# Patient Record
Sex: Male | Born: 1995
Health system: Southern US, Community
[De-identification: ages and names within clinical notes are randomized; demographics above are authoritative.]

## PROBLEM LIST (undated history)

## (undated) DIAGNOSIS — L301 Dyshidrosis [pompholyx]: Secondary | ICD-10-CM

## (undated) DIAGNOSIS — F329 Major depressive disorder, single episode, unspecified: Secondary | ICD-10-CM

## (undated) DIAGNOSIS — F32A Depression, unspecified: Secondary | ICD-10-CM

## (undated) HISTORY — DX: Dyshidrosis (pompholyx): L30.1

## (undated) HISTORY — PX: TYMPANOTOMY: SHX2588

---
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
## 2014-03-12 ENCOUNTER — Encounter (HOSPITAL_COMMUNITY): Payer: Self-pay | Admitting: Emergency Medicine

## 2014-03-12 ENCOUNTER — Emergency Department (HOSPITAL_COMMUNITY)
Admission: EM | Admit: 2014-03-12 | Discharge: 2014-03-13 | Disposition: A | Discharge status: Home / Self Care | Payer: Medicaid Other | Attending: Emergency Medicine | Admitting: Emergency Medicine

## 2014-03-12 DIAGNOSIS — Z79899 Other long term (current) drug therapy: Secondary | ICD-10-CM | POA: Insufficient documentation

## 2014-03-12 DIAGNOSIS — N3090 Cystitis, unspecified without hematuria: Secondary | ICD-10-CM | POA: Insufficient documentation

## 2014-03-12 DIAGNOSIS — R109 Unspecified abdominal pain: Secondary | ICD-10-CM | POA: Insufficient documentation

## 2014-03-12 DIAGNOSIS — Z8659 Personal history of other mental and behavioral disorders: Secondary | ICD-10-CM | POA: Diagnosis not present

## 2014-03-12 DIAGNOSIS — N50819 Testicular pain, unspecified: Secondary | ICD-10-CM

## 2014-03-12 LAB — COMPREHENSIVE METABOLIC PANEL
ALT: 32 U/L (ref 0–53)
AST: 21 U/L (ref 0–37)
Albumin: 4.4 g/dL (ref 3.5–5.2)
Alkaline Phosphatase: 54 U/L (ref 39–117)
Anion gap: 11 (ref 5–15)
BILIRUBIN TOTAL: 0.5 mg/dL (ref 0.3–1.2)
BUN: 13 mg/dL (ref 6–23)
CALCIUM: 9.6 mg/dL (ref 8.4–10.5)
CHLORIDE: 101 meq/L (ref 96–112)
CO2: 27 meq/L (ref 19–32)
Creatinine, Ser: 0.94 mg/dL (ref 0.50–1.35)
GLUCOSE: 101 mg/dL — AB (ref 70–99)
Potassium: 4.3 mEq/L (ref 3.7–5.3)
Sodium: 139 mEq/L (ref 137–147)
Total Protein: 7.1 g/dL (ref 6.0–8.3)

## 2014-03-12 LAB — CBC WITH DIFFERENTIAL/PLATELET
BASOS ABS: 0 10*3/uL (ref 0.0–0.1)
Basophils Relative: 0 % (ref 0–1)
EOS PCT: 2 % (ref 0–5)
Eosinophils Absolute: 0.1 10*3/uL (ref 0.0–0.7)
HCT: 45 % (ref 39.0–52.0)
Hemoglobin: 16.4 g/dL (ref 13.0–17.0)
LYMPHS ABS: 2.4 10*3/uL (ref 0.7–4.0)
LYMPHS PCT: 37 % (ref 12–46)
MCH: 31.2 pg (ref 26.0–34.0)
MCHC: 36.4 g/dL — ABNORMAL HIGH (ref 30.0–36.0)
MCV: 85.6 fL (ref 78.0–100.0)
Monocytes Absolute: 0.5 10*3/uL (ref 0.1–1.0)
Monocytes Relative: 8 % (ref 3–12)
NEUTROS ABS: 3.5 10*3/uL (ref 1.7–7.7)
Neutrophils Relative %: 53 % (ref 43–77)
Platelets: 176 10*3/uL (ref 150–400)
RBC: 5.26 MIL/uL (ref 4.22–5.81)
RDW: 11.9 % (ref 11.5–15.5)
WBC: 6.5 10*3/uL (ref 4.0–10.5)

## 2014-03-12 LAB — LIPASE, BLOOD: Lipase: 31 U/L (ref 11–59)

## 2014-03-12 NOTE — ED Notes (Signed)
Pt states that "5 days ago, I began having testicular pain which graduated to specifically right and left abdominal pain that radiated to my back and I pee a lot." Bilateral testicle pain.  No redness or swelling.  "I also have a growth on my penis and I want to make sure it's not a tumor".  I visualized this "growth".  Appx 1 mm or smaller bump noted to base of penis.  Pt would like for me to add that this bump has been there for 4 months.  Has been taking ibuprofen.  Denies NVD.  But states it hurts to urinate.

## 2014-03-12 NOTE — ED Provider Notes (Signed)
CSN: [id number]     Arrival date & time 03/12/14  1854 History   First MD Initiated Contact with Patient 03/12/14 2336     Chief Complaint  Patient presents with  . Abdominal Pain  . Testicle Pain     (Consider location/radiation/quality/duration/timing/severity/associated sxs/prior Treatment) The history is provided by the patient. No language interpreter was used.  Vincent Bauer is a 18 y/o M with PMHx of depression presenting to the ED, with mother, regarding abdominal pain and bilateral testicle pain that has been ongoing for approximately 1 week. Patient reported that the abdominal pain started in the groin and worked its way to the lower portion of the abdomen. Patient reported that the pain is a dull, burning pain to bilateral lower quadrants that have been constant. Patient reported that the testicle pain is an aching sensation that has been constant. Stated that he has been having increase in urination with mild discomfort after urination, described as a burning. Patient reported that he has been taking Ibuprofen with minimal relief. Mother reported that she gave the patient Azo as well. Reported mild lower back pain. Patient reported that he has been having a lesion to the base of the penis for the past 4 months - stated that he denied growth over the past 4 months - stated that there is no pain and it has mild hardness upon palpation. Denied being sexually active. Denied swelling, changes to skin color, penile discharge, bleeding, penile swelling, nausea, vomiting, diarrhea, melena, hematochezia, fever, chills, dizziness, headache, blurred vision, sudden loss of vision, syncope, increased thirst. PCP none  Past Medical History  Diagnosis Date  . Depression    Past Surgical History  Procedure Laterality Date  . Tympanotomy     No family history on file. History  Substance Use Topics  . Smoking status: Never Smoker   . Smokeless tobacco: Not on file  . Alcohol Use: No     Review of Systems  Constitutional: Negative for fever and chills.  Eyes: Negative for visual disturbance.  Respiratory: Negative for chest tightness and shortness of breath.   Cardiovascular: Negative for chest pain.  Gastrointestinal: Positive for abdominal pain. Negative for nausea, vomiting, diarrhea, constipation, blood in stool and anal bleeding.  Genitourinary: Positive for dysuria, genital sores and testicular pain. Negative for hematuria, flank pain, discharge, penile swelling, scrotal swelling and penile pain.  Musculoskeletal: Positive for back pain.  Neurological: Negative for dizziness and weakness.      Allergies  Review of patient's allergies indicates no known allergies.  Home Medications   Prior to Admission medications   Medication Sig Start Date End Date Taking? Authorizing Provider  ibuprofen (ADVIL,MOTRIN) 200 MG tablet Take 600 mg by mouth every 6 (six) hours as needed (for pain.).   Yes Historical Provider, MD  Phenazopyridine HCl (AZO TABS PO) Take 2 tablets by mouth once.   Yes Historical Provider, MD  cephALEXin (KEFLEX) 500 MG capsule Take 1 capsule (500 mg total) by mouth 2 (two) times daily. 03/13/14   Welby Montminy, PA-C   BP 123/74 mmHg  Pulse 70  Temp(Src) 98.7 F (37.1 C) (Oral)  Resp 18  SpO2 99% Physical Exam  Constitutional: He is oriented to person, place, and time. He appears well-developed and well-nourished. No distress.  HENT:  Head: Normocephalic and atraumatic.  Mouth/Throat: Oropharynx is clear and moist. No oropharyngeal exudate.  Eyes: Conjunctivae are normal. Pupils are equal, round, and reactive to light. Right eye exhibits no discharge. Left eye exhibits  no discharge.  Neck: Normal range of motion. Neck supple. No tracheal deviation present.  Cardiovascular: Normal rate, regular rhythm and normal heart sounds.   Pulmonary/Chest: Effort normal and breath sounds normal. No respiratory distress. He has no wheezes. He has no  rales.  Abdominal: Soft. Bowel sounds are normal. He exhibits no distension. There is tenderness. There is no rebound and no guarding.  Negative abdominal distention Bowel sounds normal active in all 4 quadrants Abdomen soft upon palpation Discomfort upon palpation to the right lower quadrant with rovsings Positive voluntary guarding upon palpation to the right lower quadrant  Genitourinary: Penis normal. No penile tenderness.  Penile exam: Uncircumcised. Negative swelling, erythema, inflammation, lesions, deformities identified to the penis and scrotum. Negative penile discharge. Negative lesions, chancres. Negative inguinal lymphadenopathy bilaterally. Mild discomfort upon palpation to the inguinal regions. Bilateral discomfort upon palpation to the testicles. Negative varicocele. Negative hydrocele. Negative high riding testicle. Negative direct or indirect inguinal hernia palpated. Miniscule red lesion noted to the left side of the penis, at base with negative bleeding, drainage. Negative pain upon palpation. Measures approximately 0.5 mm. Exam chaperoned with Tech  Musculoskeletal: Normal range of motion.  Lymphadenopathy:    He has no cervical adenopathy.  Neurological: He is alert and oriented to person, place, and time. No cranial nerve deficit. He exhibits normal muscle tone. Coordination normal.  Skin: Skin is warm and dry. No rash noted. He is not diaphoretic. No erythema.  Psychiatric: He has a normal mood and affect. His behavior is normal. Thought content normal.  Nursing note and vitals reviewed.   ED Course  Procedures (including critical care time)  Results for orders placed or performed during the hospital encounter of 03/12/14  CBC with Differential  Result Value Ref Range   WBC 6.5 4.0 - 10.5 K/uL   RBC 5.26 4.22 - 5.81 MIL/uL   Hemoglobin 16.4 13.0 - 17.0 g/dL   HCT 54.0 98.1 - 19.1 %   MCV 85.6 78.0 - 100.0 fL   MCH 31.2 26.0 - 34.0 pg   MCHC 36.4 (H) 30.0 - 36.0  g/dL   RDW 47.8 29.5 - 62.1 %   Platelets 176 150 - 400 K/uL   Neutrophils Relative % 53 43 - 77 %   Neutro Abs 3.5 1.7 - 7.7 K/uL   Lymphocytes Relative 37 12 - 46 %   Lymphs Abs 2.4 0.7 - 4.0 K/uL   Monocytes Relative 8 3 - 12 %   Monocytes Absolute 0.5 0.1 - 1.0 K/uL   Eosinophils Relative 2 0 - 5 %   Eosinophils Absolute 0.1 0.0 - 0.7 K/uL   Basophils Relative 0 0 - 1 %   Basophils Absolute 0.0 0.0 - 0.1 K/uL  Comprehensive metabolic panel  Result Value Ref Range   Sodium 139 137 - 147 mEq/L   Potassium 4.3 3.7 - 5.3 mEq/L   Chloride 101 96 - 112 mEq/L   CO2 27 19 - 32 mEq/L   Glucose, Bld 101 (H) 70 - 99 mg/dL   BUN 13 6 - 23 mg/dL   Creatinine, Ser 3.08 0.50 - 1.35 mg/dL   Calcium 9.6 8.4 - 65.7 mg/dL   Total Protein 7.1 6.0 - 8.3 g/dL   Albumin 4.4 3.5 - 5.2 g/dL   AST 21 0 - 37 U/L   ALT 32 0 - 53 U/L   Alkaline Phosphatase 54 39 - 117 U/L   Total Bilirubin 0.5 0.3 - 1.2 mg/dL   GFR calc non  Af Amer >90 >90 mL/min   GFR calc Af Amer >90 >90 mL/min   Anion gap 11 5 - 15  Lipase, blood  Result Value Ref Range   Lipase 31 11 - 59 U/L  Urinalysis, Routine w reflex microscopic  Result Value Ref Range   Color, Urine ORANGE (A) YELLOW   APPearance CLEAR CLEAR   Specific Gravity, Urine 1.006 1.005 - 1.030   pH 6.0 5.0 - 8.0   Glucose, UA NEGATIVE NEGATIVE mg/dL   Hgb urine dipstick NEGATIVE NEGATIVE   Bilirubin Urine NEGATIVE NEGATIVE   Ketones, ur NEGATIVE NEGATIVE mg/dL   Protein, ur NEGATIVE NEGATIVE mg/dL   Urobilinogen, UA 1.0 0.0 - 1.0 mg/dL   Nitrite POSITIVE (A) NEGATIVE   Leukocytes, UA NEGATIVE NEGATIVE  Urine microscopic-add on  Result Value Ref Range   Squamous Epithelial / LPF RARE RARE   WBC, UA 3-6 <3 WBC/hpf    Labs Review Labs Reviewed  CBC WITH DIFFERENTIAL - Abnormal; Notable for the following:    MCHC 36.4 (*)    All other components within normal limits  COMPREHENSIVE METABOLIC PANEL - Abnormal; Notable for the following:    Glucose,  Bld 101 (*)    All other components within normal limits  URINALYSIS, ROUTINE W REFLEX MICROSCOPIC - Abnormal; Notable for the following:    Color, Urine ORANGE (*)    Nitrite POSITIVE (*)    All other components within normal limits  GC/CHLAMYDIA PROBE AMP  URINE CULTURE  LIPASE, BLOOD  URINE MICROSCOPIC-ADD ON  HIV ANTIBODY (ROUTINE TESTING)  RPR    Imaging Review US Scrotum  03/13/2014   CLINICAL DATA:  Testicular pain  EXAM: SCROTAL ULTRASOUND  DOPPLER ULTRASOUND OF THE TESTICLES  TECHNIQUE: Complete ultrasound examination of the testicles, epididymis, and other scrotal structures was performed. Color and spectral Doppler ultrasound were also utilized to evaluate blood flow to the testicles.  COMPARISON:  None.  FINDINGS: Right testicle  Measurements: 4.3 x 2 x 3 cm.  No mass or microlithiasis visualized.  Left testicle  Measurements: 4.1 x 2 x 2.6 cm. There is a hyperechoic 5 mm extratesticular shadowing focus within the left scrotal sac likely representing a calcified scrotal pearl. No mass or microlithiasis visualized.  Right epididymis:  Normal in size and appearance.  Left epididymis:  Normal in size and appearance.  Hydrocele:  None visualized.  Varicocele:  None visualized.  Pulsed Doppler interrogation of both testes demonstrates low resistance arterial and venous waveforms bilaterally.  IMPRESSION: 1. No testicular torsion. 2. No testicular mass.   Electronically Signed   By: Elige Ko   On: 03/13/2014 01:49   Ct Abdomen Pelvis W Contrast  03/13/2014   CLINICAL DATA:  Testicular pain for 5 days.  Painful urination.  EXAM: CT ABDOMEN AND PELVIS WITH CONTRAST  TECHNIQUE: Multidetector CT imaging of the abdomen and pelvis was performed using the standard protocol following bolus administration of intravenous contrast.  CONTRAST:  50mL OMNIPAQUE IOHEXOL 300 MG/ML SOLN, OMNIPAQUE IOHEXOL 300 MG/ML SOLN  COMPARISON:  None.  FINDINGS: The lung bases are clear.  The liver  demonstrates no focal abnormality. There is no intrahepatic or extrahepatic biliary ductal dilatation. The gallbladder is normal. The spleen demonstrates no focal abnormality. The kidneys, adrenal glands and pancreas are normal. The bladder is unremarkable.  The stomach, duodenum, small intestine, and large intestine demonstrate no contrast extravasation or dilatation. There is a normal caliber appendix in the right lower quadrant without periappendiceal inflammatory changes. There  is no pneumoperitoneum, pneumatosis, or portal venous gas. There is no abdominal or pelvic free fluid. There is no lymphadenopathy.  The abdominal aorta is normal in caliber .  There are no lytic or sclerotic osseous lesions.  IMPRESSION: No acute abdominal or pelvic pathology.   Electronically Signed   By: Elige KoHetal  Patel   On: 03/13/2014 02:50   [place] Art/ven Flow Abd Pelv Doppler  03/13/2014   CLINICAL DATA:  Testicular pain  EXAM: SCROTAL ULTRASOUND  DOPPLER ULTRASOUND OF THE TESTICLES  TECHNIQUE: Complete ultrasound examination of the testicles, epididymis, and other scrotal structures was performed. Color and spectral Doppler ultrasound were also utilized to evaluate blood flow to the testicles.  COMPARISON:  None.  FINDINGS: Right testicle  Measurements: 4.3 x 2 x 3 cm.  No mass or microlithiasis visualized.  Left testicle  Measurements: 4.1 x 2 x 2.6 cm. There is a hyperechoic 5 mm extratesticular shadowing focus within the left scrotal sac likely representing a calcified scrotal pearl. No mass or microlithiasis visualized.  Right epididymis:  Normal in size and appearance.  Left epididymis:  Normal in size and appearance.  Hydrocele:  None visualized.  Varicocele:  None visualized.  Pulsed Doppler interrogation of both testes demonstrates low resistance arterial and venous waveforms bilaterally.  IMPRESSION: 1. No testicular torsion. 2. No testicular mass.   Electronically Signed   By: Elige KoHetal  Patel   On: 03/13/2014 01:49     EKG  Interpretation None      MDM   Final diagnoses:  Testicle pain  Abdominal pain  Cystitis   Medications  cefTRIAXone (ROCEPHIN) injection 250 mg (not administered)  azithromycin (ZITHROMAX) tablet 1,000 mg (not administered)  sodium chloride 0.9 % bolus 1,000 mL (0 mLs Intravenous Stopped 03/13/14 0200)  iohexol (OMNIPAQUE) 300 MG/ML solution 50 mL (50 mLs Oral Contrast Given 03/13/14 0215)  morphine 2 MG/ML injection 2 mg (2 mg Intravenous Given 03/13/14 0249)  iohexol (OMNIPAQUE) 300 MG/ML solution 100 mL (100 mLs Intravenous Contrast Given 03/13/14 0231)    Filed Vitals:   03/12/14 1911 03/12/14 2222 03/13/14 0000 03/13/14 0057  BP: 131/70 143/71  123/74  Pulse: 76 90  70  Temp: 98.7 F (37.1 C) 98.7 F (37.1 C)    TempSrc: Oral Oral    Resp: 18 18  18   SpO2: 100% 100% 99% 99%   CBC negative elevated white blood cell count. Hemoglobin 16.4, hematocrit 45.0. CMP unremarkable. Glucose 101. Negative elevated anion gap-11.0 mEq per liter. Lipase negative elevation. Urinalysis positive for nitrates with a white blood cell count of 3-6. Urine culture pending. HIV and GC chlamydia urine pending. Scrotal ultrasound and Doppler no testicular torsion or mass identified. CT abdomen and pelvis with contrast noted acute abdominal or pelvic pathology noted. Doubt appendicitis. Doubt pancreatitis. Doubt acute inflammatory abdominal processes. Negative findings of testicular torsion. Doubt epididymitis. Doubt pyelonephritis. Suspicion to be possible cystitis secondary to nitrites identified and urine. Covered patient prophylactically. Negative signs of sepsis-patient afebrile, not tachycardia, no tachypneic. Negative elevated white blood cell count. Discharged patient. Discharged patient with antibiotics. Discussed with patient to rest and stay hydrated. Discussed with patient to avoid any physical or strenuous activity. [facility]. Discussed with  patient to follow-up with urology regarding cystitis as well as lesion on penis that has been present for 4 months. Discussed with patient to closely monitor symptoms and if symptoms are to worsen or change to report back to the ED - strict return  instructions given.  Patient agreed to plan of care, understood, all questions answered.   Raymon Mutton, PA-C 03/13/14 1610  Tomasita Crumble, MD 03/13/14 [phone]

## 2014-03-13 ENCOUNTER — Emergency Department (HOSPITAL_COMMUNITY): Payer: Medicaid Other

## 2014-03-13 LAB — URINALYSIS, ROUTINE W REFLEX MICROSCOPIC
BILIRUBIN URINE: NEGATIVE
Glucose, UA: NEGATIVE mg/dL
HGB URINE DIPSTICK: NEGATIVE
KETONES UR: NEGATIVE mg/dL
LEUKOCYTES UA: NEGATIVE
Nitrite: POSITIVE — AB
PH: 6 (ref 5.0–8.0)
PROTEIN: NEGATIVE mg/dL
Specific Gravity, Urine: 1.006 (ref 1.005–1.030)
Urobilinogen, UA: 1 mg/dL (ref 0.0–1.0)

## 2014-03-13 LAB — RPR

## 2014-03-13 LAB — URINE MICROSCOPIC-ADD ON

## 2014-03-13 LAB — HIV ANTIBODY (ROUTINE TESTING W REFLEX): HIV 1&2 Ab, 4th Generation: NONREACTIVE

## 2014-03-13 MED ORDER — IOHEXOL 300 MG/ML  SOLN
50.0000 mL | Freq: Once | INTRAMUSCULAR | Status: AC | PRN
  Administered 2014-03-13: 50 mL via ORAL

## 2014-03-13 MED ORDER — CEFTRIAXONE SODIUM 250 MG IJ SOLR
250.0000 mg | Freq: Once | INTRAMUSCULAR | Status: AC
  Administered 2014-03-13: 250 mg via INTRAMUSCULAR
  Filled 2014-03-13: qty 250

## 2014-03-13 MED ORDER — LIDOCAINE HCL 1 % IJ SOLN
INTRAMUSCULAR | Status: AC
  Administered 2014-03-13: 0.9 mL via INTRAMUSCULAR
  Filled 2014-03-13: qty 20

## 2014-03-13 MED ORDER — SODIUM CHLORIDE 0.9 % IV BOLUS (SEPSIS)
1000.0000 mL | Freq: Once | INTRAVENOUS | Status: AC
  Administered 2014-03-13: 1000 mL via INTRAVENOUS

## 2014-03-13 MED ORDER — AZITHROMYCIN 250 MG PO TABS
1000.0000 mg | ORAL_TABLET | Freq: Once | ORAL | Status: AC
  Administered 2014-03-13: 1000 mg via ORAL
  Filled 2014-03-13: qty 4

## 2014-03-13 MED ORDER — MORPHINE SULFATE 2 MG/ML IJ SOLN
2.0000 mg | Freq: Once | INTRAMUSCULAR | Status: AC
  Administered 2014-03-13: 2 mg via INTRAVENOUS
  Filled 2014-03-13: qty 1

## 2014-03-13 MED ORDER — CEPHALEXIN 500 MG PO CAPS: 500.0000 mg | ORAL_CAPSULE | Freq: Two times a day (BID) | ORAL | Status: DC

## 2014-03-13 MED ORDER — IOHEXOL 300 MG/ML  SOLN
100.0000 mL | Freq: Once | INTRAMUSCULAR | Status: AC | PRN
  Administered 2014-03-13: 100 mL via INTRAVENOUS

## 2014-03-13 NOTE — ED Notes (Signed)
Awake. Verbally responsive. Resp even and unlabored. ABC's intact. Mother at bedside.

## 2014-03-13 NOTE — ED Notes (Signed)
Resting quietly with eyes closed. Easily arousable. Verbally responsive. Resp even and unlabored. ABC's intact.  

## 2014-03-13 NOTE — ED Notes (Signed)
Patient transported to CT 

## 2014-03-13 NOTE — ED Notes (Signed)
Awake. Verbally responsive. Resp even and unlabored. ABC's intact. IV saline lock. Mother at bedside.

## 2014-03-13 NOTE — ED Notes (Addendum)
Resting quietly with eyes closed. Easily arousable. Verbally responsive. Resp even and unlabored. IV SL patent and intact. Family at bedside. NAD noted.

## 2014-03-13 NOTE — ED Notes (Signed)
Pt ambulated to BR. Gait steady. Resp even and unlabored. ABC's intact. NAD noted.

## 2014-03-13 NOTE — Discharge Instructions (Signed)
Please call your doctor for a followup appointment within 24-48 hours. When you talk to your doctor please let them know that you were seen in the emergency department and have them acquire all of your records so that they can discuss the findings with you and formulate a treatment plan to fully care for your new and ongoing problems. Please call and set-up an appointment with [facility] Please rest and stay hydrated Please avoid any physical or strenuous activity  Please take medications on a full stomach  Please continue to monitor symptoms closely and if symptoms are to worsen or change (fever greater than 101, chills, sweating, nausea, vomiting, chest pain, shortness of breathe, difficulty breathing, weakness, numbness, tingling, worsening or changes to pain pattern, swelling, penile discharge, inability to keep food or fluid down) please report back to the Emergency Department immediately.   Urinary Tract Infection Urinary tract infections (UTIs) can develop anywhere along your urinary tract. Your urinary tract is your body's drainage system for removing wastes and extra water. Your urinary tract includes two kidneys, two ureters, a bladder, and a urethra. Your kidneys are a pair of bean-shaped organs. Each kidney is about the size of your fist. They are located below your ribs, one on each side of your spine. CAUSES Infections are caused by microbes, which are microscopic organisms, including fungi, viruses, and bacteria. These organisms are so small that they can only be seen through a microscope. Bacteria are the microbes that most commonly cause UTIs. SYMPTOMS  Symptoms of UTIs may vary by age and gender of the patient and by the location of the infection. Symptoms in young women typically include a frequent and intense urge to urinate and a painful, burning feeling in the bladder or urethra during urination. Older women and men are more likely to be tired, shaky, and  weak and have muscle aches and abdominal pain. A fever may mean the infection is in your kidneys. Other symptoms of a kidney infection include pain in your back or sides below the ribs, nausea, and vomiting. DIAGNOSIS To diagnose a UTI, your caregiver will ask you about your symptoms. Your caregiver also will ask to provide a urine sample. The urine sample will be tested for bacteria and white blood cells. White blood cells are made by your body to help fight infection. TREATMENT  Typically, UTIs can be treated with medication. Because most UTIs are caused by a bacterial infection, they usually can be treated with the use of antibiotics. The choice of antibiotic and length of treatment depend on your symptoms and the type of bacteria causing your infection. HOME CARE INSTRUCTIONS  If you were prescribed antibiotics, take them exactly as your caregiver instructs you. Finish the medication even if you feel better after you have only taken some of the medication.  Drink enough water and fluids to keep your urine clear or pale yellow.  Avoid caffeine, tea, and carbonated beverages. They tend to irritate your bladder.  Empty your bladder often. Avoid holding urine for long periods of time.  Empty your bladder before and after sexual intercourse.  After a bowel movement, women should cleanse from front to back. Use each tissue only once. SEEK MEDICAL CARE IF:   You have back pain.  You develop a fever.  Your symptoms do not begin to resolve within 3 days. SEEK IMMEDIATE MEDICAL CARE IF:   You have severe back pain or lower abdominal pain.  You develop chills.  You  have nausea or vomiting.  You have continued burning or discomfort with urination. MAKE SURE YOU:   Understand these instructions.  Will watch your condition.  Will get help right away if you are not doing well or get worse. Document Released: 01/23/2005 Document Revised: 10/15/2011 Document Reviewed:  05/24/2011 [facility] Patient Information 2015 [employer] [place]. This information is not intended to replace advice given to you by your health care provider. Make sure you discuss any questions you have with your health care provider.   Emergency Department Resource Guide 1) Find a Doctor and Pay Out of Pocket Although you won't have to find out who is covered by your insurance plan, it is a good idea to ask around and get recommendations. You will then need to call the office and see if the doctor you have chosen will accept you as a new patient and what types of options they offer for patients who are self-pay. Some doctors offer discounts or will set up payment plans for their patients who do not have insurance, but you will need to ask so you aren't surprised when you get to your appointment.  2) Contact Your Local Health Department Not all health departments have doctors that can see patients for sick visits, but many do, so it is worth a call to see if yours does. If you don't know where your local health department is, you can check in your phone book. The CDC also has a tool to help you locate your state's health department, and many state websites also have listings of all of their local health departments.  3) Find a Walk-in Clinic If your illness is not likely to be very severe or complicated, you may want to try a walk in clinic. These are popping up all over the country in pharmacies, drugstores, and shopping centers. They're usually staffed by nurse practitioners or physician assistants that have been trained to treat common illnesses and complaints. They're usually fairly quick and inexpensive. However, if you have serious medical issues or chronic medical problems, these are probably not your best option.  No Primary Care Doctor: - Call Health Connect at  [phone] - they can help you locate a primary care doctor that  accepts your insurance, provides certain services, etc. - Physician  Referral Service- [phone]  Chronic Pain Problems: Organization         Address  Phone   Notes  [facility]  [phone] Patients need to be referred by their primary care doctor.   Medication Assistance: Organization         Address  Phone   Notes  [facility] Medication [facility] [address] [place], [place] [address] [phone] --Must be a resident of [place] -- Must have NO insurance coverage whatsoever (no Medicaid/ Medicare, etc.) -- The pt. MUST have a primary care doctor that directs their care regularly and follows them in the community   MedAssist  [phone]   [employer]  [phone]    Agencies that provide inexpensive medical care: Organization         Address  Phone   Notes  [facility]  [phone]) [phone]   [facility]    [phone]) [phone]   [facility] [address] [place], [place] [address] [phone]   [facility] [address], [place] [phone]) [phone]   Planned Parenthood    [phone]) [phone]  [facility]    [phone]   [facility]  [address], [place] Phone:  [phone](336) [phone], Fax:  [phone] Hours of Operation:  9 am - 6 pm, M-F.  Also accepts Medicaid/Medicare and self-pay.  [facility]  [address], [place] Phone: [phone], Fax: [phone]. Hours of Operation:  8:30 am - 5:30 pm, M-F.  Also accepts Medicaid and self-pay.  [facility] [place] [address], [place] Phone: [phone]) [phone]   [facility] [address], [place], [place] [phone], Ext. [phone] Mondays & Thursdays: 7-9 AM.  First 15 patients are seen on a first come, first serve basis.    Medicaid-accepting [facility] Providers:  Organization         Address  Phone   Notes  [facility] [address], [place] [phone] Also accepts self-pay patients.  [facility] [address], [place]  [phone]) [phone]   [facility] [address], [place] [phone](336) [phone]   [facility] [address] [place] [address], [place] [phone]) [phone]   Renaye RakersVeita Bland [address], [place]   [phone] Only accepts [place] Access [place] patients after they have their name applied to their card.   Self-Pay (no insurance) in [facility]:  Organization         Address  Phone   Notes  Sickle Cell Patients, [facility] [address], [place] [phone]) [phone]   [facility] [address], [place] [phone]   [facility] [place]  [address], [place] [phone]) [phone]   [facility]/Dr. Osei-Bonsu  [address] [place] [address], [place] or [phone], [place] [phone]) [phone] Phone number for both [place] and [place] locations is the same.  [facility] [address], [place] [phone]   [facility] [address] [place] [address], [place] or [address] [phone]) [phone]   [facility] [address], [place] [phone]) [phone], phone; [phone], fax Sees patients 1st and 3rd Saturday of every month.  Must not qualify for public or private insurance (i.e. Medicaid, Medicare, [place] Health Choice, Veterans' Benefits)  Household income should be no more than 200% of the poverty level The clinic cannot treat you if you are pregnant or think you are pregnant  Sexually transmitted diseases are not treated at the clinic.    Dental Care: Organization         Address  Phone  Notes  [facility] Department of [facility] [address], [place] [phone] Accepts children up to age 19 who are enrolled  in [place] or [place] Health Choice; pregnant women with a Medicaid card; and children who have applied for Medicaid or [place] Health Choice, but were declined, whose parents can pay a reduced fee at time of service.  [facility] of [facility] [place]  [address], [place] [phone]) [phone] Accepts children up to age 19 who are enrolled in [place] or [place] Health Choice; pregnant women with a Medicaid card; and children who have applied for Medicaid or [place] Health Choice, but were declined, whose parents can pay a reduced fee at time  of service.  [facility]  [address] [place], [place] [phone] Patients are seen by appointment only. Walk-ins are not accepted. [facility] will see patients 77 years of age and older. Monday - Tuesday (8am-5pm) Most Wednesdays (8:30-5pm) $30 per visit, cash only  [facility] Adult Dental Access PROGRAM  [address], [facility] [phone] Patients are seen by appointment only. Walk-ins are not accepted. [facility] will see patients 57 years of age and older. One Wednesday Evening (Monthly: Volunteer Based).  $30 per visit, cash only  [employer]  [phone] for adults; Children under age 62, call Graduate Pediatric Dentistry at [phone]. Children aged 55-14, please call [phone] to request a pediatric application.  Dental services are provided in all areas of dental care including fillings, crowns and bridges, complete and partial dentures, implants, gum treatment, root canals, and extractions. Preventive care is also provided. Treatment is provided to both adults and children. Patients are selected via a lottery and there is often a waiting list.   [facility] [address], [place]  [phone] [email or web address]   [facility] [address] [place], [place] [phone], Ext. [phone] Second and Fourth Thursday of each month, opens at  6:30 AM; Clinic ends at 9 AM.  Patients are seen on a first-come first-served basis, and a limited number are seen during each clinic.   [facility]  [address] [place], [place] [phone]   Eligibility Requirements You must have lived in [place], [place], or [place] counties for at least the last three months.   You cannot be eligible for state or federal sponsored [place], including [employer], [place], or [employer].   You generally cannot be eligible for healthcare insurance through your employer.    How to apply: Eligibility screenings are held every Tuesday and Wednesday afternoon from 1:00 pm until 4:00 pm. You do not need an appointment for the interview!  [facility] [address], [place], [place] [phone]   [facility] Health Department  [phone]   [facility] Health Department  [phone]   [facility] Health Department  [phone]    Behavioral Health Resources in the Community: Intensive Outpatient Programs Organization         Address  Phone  Notes  [facility] [address], [place], [place] [phone]   [facility] [address], [place], [place] [phone]   [facility] [address], [place], [place]  [phone]   [facility] [address],  [place], [place] [phone] or [phone]   Substance Abuse Resources Organization         Address  Phone  Notes  [facility] Drug [facility]  [phone]   [facility]  [phone]   [facility] [place]  [phone]   Floydene Flock  [phone]   Residential & Outpatient Substance Abuse Program  [phone]   Psychological Services Organization         Address  Phone  Notes  [facility] Health  [phone]   [facility]  [phone]   [facility] [address], [place]  [phone] or [phone]    Mobile Crisis Teams Organization         Address  Phone  Notes  [facility], Mobile Crisis Care Unit  [phone]   [facility] Psychotherapeutic  Services  [address] [place], [place] [phone]   [facility] [address] [place] [phone]    Self-Help/Support Groups Organization         Address  Phone             Notes  [facility] [place] - variety of support groups  336- I7437963 Call for more information  Narcotics Anonymous (NA), [facility] [address], [employer] [place]  2 meetings at this location   Statistician         Address  Phone  Notes  [facility] [address],    [place]  [phone]   [facility]  [address], [place] [address], [place], [place] [phone]   [facility] [address] [place], [place] [phone] Admissions: 8am-3pm M-F  [facility] [address]    [place], [place] [phone]   [facility] [address] [place], [place], [place] [phone]   [facility] [address]  [place], [place] [phone]   [facility] - Intensive Outpatient [address], [place], [place] [phone]   [facility] (Addiction Recovery [facility] [address]  [place], [place] [phone] or [phone]   [facility] (RT[facility]) [address], [place], [place] [phone] Accepts Medicaid  [facility] [address],  [place] [phone] Substance Abuse/Addiction Treatment   [facility] Organization         Address  Phone  Notes  [facility]  [phone]   Angie Fava, PhD [address]vin Knack [place], [place]   [phone] or [phone]   [facility] Behavioral   [address] [place], [place] [phone]   [facility]  [address] [place], [place] [phone] Insurance/Medicaid/sponsorship through [facility] and Families [address]                                    [place], [place] [phone] Therapy/tele-psych/case  [facility] [address], [place] [phone]    Dr. Lolly Mustache  [phone]   [facility] [place]  [employer] [facility] Dept. 1) [address] [place] 2) [address] [place] 3)  [address], [place] [phone]  [phone]   [facility] Child Abuse Hotline [phone] or [phone] (After Hours)

## 2014-03-15 LAB — URINE CULTURE
CULTURE: NO GROWTH
Colony Count: NO GROWTH
Special Requests: NORMAL

## 2014-03-15 LAB — GC/CHLAMYDIA PROBE AMP
CT Probe RNA: NEGATIVE
GC Probe RNA: NEGATIVE

## 2014-07-09 ENCOUNTER — Emergency Department (HOSPITAL_COMMUNITY): Payer: Medicaid Other

## 2014-07-09 ENCOUNTER — Encounter (HOSPITAL_COMMUNITY): Payer: Self-pay

## 2014-07-09 DIAGNOSIS — R0789 Other chest pain: Secondary | ICD-10-CM | POA: Insufficient documentation

## 2014-07-09 DIAGNOSIS — Z792 Long term (current) use of antibiotics: Secondary | ICD-10-CM | POA: Insufficient documentation

## 2014-07-09 DIAGNOSIS — F419 Anxiety disorder, unspecified: Secondary | ICD-10-CM | POA: Insufficient documentation

## 2014-07-09 DIAGNOSIS — R002 Palpitations: Secondary | ICD-10-CM | POA: Diagnosis not present

## 2014-07-09 DIAGNOSIS — R079 Chest pain, unspecified: Secondary | ICD-10-CM | POA: Diagnosis present

## 2014-07-09 DIAGNOSIS — M549 Dorsalgia, unspecified: Secondary | ICD-10-CM | POA: Diagnosis not present

## 2014-07-09 LAB — CBC
HEMATOCRIT: 43.8 % (ref 39.0–52.0)
Hemoglobin: 16.5 g/dL (ref 13.0–17.0)
MCH: 32 pg (ref 26.0–34.0)
MCHC: 37.7 g/dL — ABNORMAL HIGH (ref 30.0–36.0)
MCV: 85 fL (ref 78.0–100.0)
PLATELETS: 202 10*3/uL (ref 150–400)
RBC: 5.15 MIL/uL (ref 4.22–5.81)
RDW: 12.3 % (ref 11.5–15.5)
WBC: 8.1 10*3/uL (ref 4.0–10.5)

## 2014-07-09 LAB — BASIC METABOLIC PANEL
Anion gap: 10 (ref 5–15)
BUN: 13 mg/dL (ref 6–23)
CO2: 20 mmol/L (ref 19–32)
Calcium: 9.9 mg/dL (ref 8.4–10.5)
Chloride: 109 mmol/L (ref 96–112)
Creatinine, Ser: 0.81 mg/dL (ref 0.50–1.35)
GFR calc Af Amer: >90 mL/min (ref >90)
Glucose, Bld: 115 mg/dL — ABNORMAL HIGH (ref 70–99)
Potassium: 3.3 mmol/L — ABNORMAL LOW (ref 3.5–5.1)
Sodium: 139 mmol/L (ref 135–145)

## 2014-07-09 LAB — I-STAT TROPONIN, ED: TROPONIN I, POC: 0 ng/mL (ref 0.00–0.08)

## 2014-07-09 NOTE — ED Notes (Signed)
Onset 1 week intermittant right chest pain that is now on left side of chest, left arm numbness and shortness of breath.  Pt very anxious, talking in complete sentences.  No respiratory distress at triage.

## 2014-07-10 ENCOUNTER — Emergency Department (HOSPITAL_COMMUNITY)
Admission: EM | Admit: 2014-07-10 | Discharge: 2014-07-10 | Disposition: A | Discharge status: Home / Self Care | Payer: Medicaid Other | Attending: Emergency Medicine | Admitting: Emergency Medicine

## 2014-07-10 DIAGNOSIS — R0789 Other chest pain: Secondary | ICD-10-CM

## 2014-07-10 DIAGNOSIS — F419 Anxiety disorder, unspecified: Secondary | ICD-10-CM

## 2014-07-10 LAB — BRAIN NATRIURETIC PEPTIDE: B NATRIURETIC PEPTIDE 5: 4.1 pg/mL (ref 0.0–100.0)

## 2014-07-10 MED ORDER — TRAMADOL HCL 50 MG PO TABS: 50.0000 mg | ORAL_TABLET | Freq: Four times a day (QID) | ORAL | Status: DC | PRN

## 2014-07-10 MED ORDER — DIAZEPAM 5 MG PO TABS: 5.0000 mg | ORAL_TABLET | Freq: Three times a day (TID) | ORAL | Status: DC | PRN

## 2014-07-10 MED ORDER — TRAMADOL HCL 50 MG PO TABS
50.0000 mg | ORAL_TABLET | Freq: Four times a day (QID) | ORAL | Status: DC | PRN
Start: 2014-07-10 — End: 2014-07-10

## 2014-07-10 MED ORDER — NAPROXEN 500 MG PO TABS: 500.0000 mg | ORAL_TABLET | Freq: Two times a day (BID) | ORAL | Status: DC

## 2014-07-10 MED ORDER — NAPROXEN 250 MG PO TABS
500.0000 mg | ORAL_TABLET | Freq: Two times a day (BID) | ORAL | Status: DC
  Administered 2014-07-10: 500 mg via ORAL
  Filled 2014-07-10: qty 2

## 2014-07-10 MED ORDER — DIAZEPAM 5 MG PO TABS
5.0000 mg | ORAL_TABLET | Freq: Once | ORAL | Status: AC
  Administered 2014-07-10: 5 mg via ORAL
  Filled 2014-07-10: qty 1

## 2014-07-10 NOTE — ED Provider Notes (Signed)
CSN: [id number]     Arrival date & time 07/09/14  2206 History  This chart was scribed for Marisa Severin, MD by Abel Presto, ED Scribe. This patient was seen in room A13C/A13C and the patient's care was started at 3:20 AM.    Chief Complaint  Patient presents with  . Chest Pain    Patient is a 19 y.o. male presenting with chest pain. The history is provided by the patient. No language interpreter was used.  Chest Pain Associated symptoms: back pain, headache, numbness and palpitations    HPI Comments: Vincent Bauer is a 19 y.o. male who presents to the Emergency Department complaining of worsening intermittent sharp and pressure chest pain with onset a week ago. Pt states pain has been constant for 2 days. Pt notes deep inspiration aggravates the pain. Pt notes associated anxiety, intermittent headaches, recurrent palpitations lasting a couple minutes only during first 2 days of onset of sypmtoms, numbness and pain in bilateral upper extremities and intermittent back pain. Pt states he has been unable to sleep unless he "physically exhausts" himself.  Pt has taken ibuprofen and drinking a "calming" tea with no relief. Pt notes caffeine use of approximately 1 soda per day but he states he has not drank any since onset.  Pt does much heavy lifting at work. Pt denies h/o anxiety and any known medical problems. Pt is an occasional smoker. Pt denies recent prolonged travel and bilateral leg swelling and pain.   Past Medical History  Diagnosis Date  . Depression    Past Surgical History  Procedure Laterality Date  . Tympanotomy     History reviewed. No pertinent family history. History  Substance Use Topics  . Smoking status: Light Tobacco Smoker  . Smokeless tobacco: Not on file  . Alcohol Use: No    Review of Systems  Cardiovascular: Positive for chest pain and palpitations. Negative for leg swelling.  Musculoskeletal: Positive for myalgias and back pain.  Neurological: Positive for  numbness and headaches.  Psychiatric/Behavioral: The patient is nervous/anxious.       Allergies  Review of patient's allergies indicates no known allergies.  Home Medications   Prior to Admission medications   Medication Sig Start Date End Date Taking? Authorizing Provider  cephALEXin (KEFLEX) 500 MG capsule Take 1 capsule (500 mg total) by mouth 2 (two) times daily. 03/13/14   Marissa Sciacca, PA-C  ibuprofen (ADVIL,MOTRIN) 200 MG tablet Take 600 mg by mouth every 6 (six) hours as needed (for pain.).    Historical Provider, MD  Phenazopyridine HCl (AZO TABS PO) Take 2 tablets by mouth once.    Historical Provider, MD   BP 126/77 mmHg  Pulse 73  Temp(Src) 98.4 F (36.9 C) (Oral)  Resp 14  Ht  (1.753 m)  Wt 160 lb (72.576 kg)  BMI 23.62 kg/m2  SpO2 99% Physical Exam  Constitutional: He is oriented to person, place, and time. He appears well-developed and well-nourished.  HENT:  Head: Normocephalic and atraumatic.  Nose: Nose normal.  Mouth/Throat: Oropharynx is clear and moist.  Eyes: Conjunctivae and EOM are normal. Pupils are equal, round, and reactive to light.  Neck: Normal range of motion. Neck supple. No JVD present. No tracheal deviation present. No thyromegaly present.  Cardiovascular: Normal rate, regular rhythm, normal heart sounds and intact distal pulses.  Exam reveals no gallop and no friction rub.   No murmur heard. Pulmonary/Chest: Effort normal and breath sounds normal. No stridor. No respiratory distress. He has  no wheezes. He has no rales. He exhibits tenderness (patient has tenderness to palpation under clavicles bilaterally right greater than left).  Abdominal: Soft. Bowel sounds are normal. He exhibits no distension and no mass. There is no tenderness. There is no rebound and no guarding.  Musculoskeletal: Normal range of motion. He exhibits no edema or tenderness.  Lymphadenopathy:    He has no cervical adenopathy.  Neurological: He is alert and  oriented to person, place, and time. He displays normal reflexes. He exhibits normal muscle tone. Coordination normal.  Skin: Skin is warm and dry. No rash noted. No erythema. No pallor.  Psychiatric: He has a normal mood and affect. His behavior is normal. Judgment and thought content normal.  Anxious appearing, pressured speech  Nursing note and vitals reviewed.   ED Course  Procedures (including critical care time) DIAGNOSTIC STUDIES: Oxygen Saturation is 99% on room air, normal by my interpretation.    COORDINATION OF CARE: 3:34 AM Discussed treatment plan with patient at beside, the patient agrees with the plan and has no further questions at this time.   Labs Review Labs Reviewed  CBC - Abnormal; Notable for the following:    MCHC 37.7 (*)    All other components within normal limits  BASIC METABOLIC PANEL - Abnormal; Notable for the following:    Potassium 3.3 (*)    Glucose, Bld 115 (*)    All other components within normal limits  BRAIN NATRIURETIC PEPTIDE  I-STAT TROPOININ, ED    Imaging Review Dg Chest 2 View  07/09/2014   CLINICAL DATA:  Acute onset of shortness of breath and generalized chest pain. Left arm tingling and numbness, and facial numbness. Initial encounter.  EXAM: CHEST  2 VIEW  COMPARISON:  Chest radiograph performed 02/17/2013  FINDINGS: The lungs are well-aerated and clear. There is no evidence of focal opacification, pleural effusion or pneumothorax.  The heart is normal in size; the mediastinal contour is within normal limits. No acute osseous abnormalities are seen.  IMPRESSION: No acute cardiopulmonary process seen.   Electronically Signed   By: Roanna RaiderJeffery  Chang M.D.   On: 07/09/2014 22:54     EKG Interpretation   Date/Time:  Saturday July 09 2014 22:14:01 EST Ventricular Rate:  84 PR Interval:  108 QRS Duration: 90 QT Interval:  334 QTC Calculation: 394 R Axis:   84 Text Interpretation:  Sinus rhythm with marked sinus arrhythmia with short   PR Otherwise normal ECG Confirmed by Eathel Pajak  MD, Tabb Croghan ([id number]) on 07/10/2014  1:32:49 AM     Results for orders placed or performed during the hospital encounter of 07/10/14  CBC  Result Value Ref Range   WBC 8.1 4.0 - 10.5 K/uL   RBC 5.15 4.22 - 5.81 MIL/uL   Hemoglobin 16.5 13.0 - 17.0 g/dL   HCT 11.943.8 14.739.0 - 82.952.0 %   MCV 85.0 78.0 - 100.0 fL   MCH 32.0 26.0 - 34.0 pg   MCHC 37.7 (H) 30.0 - 36.0 g/dL   RDW 56.212.3 13.011.5 - 86.515.5 %   Platelets 202 150 - 400 K/uL  Basic metabolic panel  Result Value Ref Range   Sodium 139 135 - 145 mmol/L   Potassium 3.3 (L) 3.5 - 5.1 mmol/L   Chloride 109 96 - 112 mmol/L   CO2 20 19 - 32 mmol/L   Glucose, Bld 115 (H) 70 - 99 mg/dL   BUN 13 6 - 23 mg/dL   Creatinine, Ser 7.840.81 0.50 - 1.35 mg/dL  Calcium 9.9 8.4 - 10.5 mg/dL   GFR calc non Af Amer >90 >90 mL/min   GFR calc Af Amer >90 >90 mL/min   Anion gap 10 5 - 15  BNP (order ONLY if patient complains of dyspnea/SOB AND you have documented it for THIS visit)  Result Value Ref Range   B Natriuretic Peptide 4.1 0.0 - 100.0 pg/mL  I-stat troponin, ED (not at [facility])  Result Value Ref Range   Troponin i, poc 0.00 0.00 - 0.08 ng/mL   Comment 3              MDM   Final diagnoses:  None   19 year old male with a week of chest pain with palpitations.  Workup here unremarkable.  He has some tenderness on exam but does not seem to completely explain symptoms.  He does have an anxious quality to his symptoms as well.  Patient with short PR and sinus arrhythmia.  Will have him follow-up with cardiology as well as establishing with a primary care doctor.  No signs of ischemia on EKG or lab work.  Symptoms do not seem consistent with PE, dissection, cardiac ischemia.  Plan to discharge home with an anti-inflammatory, Naprosyn, tramadol and Valium.  I personally performed the services described in this documentation, which was scribed in my presence. The recorded information has been reviewed and is  accurate.      Marisa Severin, MD 07/10/14 [phone]

## 2014-07-10 NOTE — Discharge Instructions (Signed)
Chest Pain (Nonspecific) °It is often hard to give a specific diagnosis for the cause of chest pain. There is always a chance that your pain could be related to something serious, such as a heart attack or a blood clot in the lungs. You need to follow up with your health care provider for further evaluation. °CAUSES  °· Heartburn. °· Pneumonia or bronchitis. °· Anxiety or stress. °· Inflammation around your heart (pericarditis) or lung (pleuritis or pleurisy). °· A blood clot in the lung. °· A collapsed lung (pneumothorax). It can develop suddenly on its own (spontaneous pneumothorax) or from trauma to the chest. °· Shingles infection (herpes zoster virus). °The chest wall is composed of bones, muscles, and cartilage. Any of these can be the source of the pain. °· The bones can be bruised by injury. °· The muscles or cartilage can be strained by coughing or overwork. °· The cartilage can be affected by inflammation and become sore (costochondritis). °DIAGNOSIS  °Lab tests or other studies may be needed to find the cause of your pain. Your health care provider may have you take a test called an ambulatory electrocardiogram (ECG). An ECG records your heartbeat patterns over a 24-hour period. You may also have other tests, such as: °· Transthoracic echocardiogram (TTE). During echocardiography, sound waves are used to evaluate how blood flows through your heart. °· Transesophageal echocardiogram (TEE). °· Cardiac monitoring. This allows your health care provider to monitor your heart rate and rhythm in real time. °· Holter monitor. This is a portable device that records your heartbeat and can help diagnose heart arrhythmias. It allows your health care provider to track your heart activity for several days, if needed. °· Stress tests by exercise or by giving medicine that makes the heart beat faster. °TREATMENT  °· Treatment depends on what may be causing your chest pain. Treatment may include: °· Acid blockers for  heartburn. °· Anti-inflammatory medicine. °· Pain medicine for inflammatory conditions. °· Antibiotics if an infection is present. °· You may be advised to change lifestyle habits. This includes stopping smoking and avoiding alcohol, caffeine, and chocolate. °· You may be advised to keep your head raised (elevated) when sleeping. This reduces the chance of acid going backward from your stomach into your esophagus. °Most of the time, nonspecific chest pain will improve within 2-3 days with rest and mild pain medicine.  °HOME CARE INSTRUCTIONS  °· If antibiotics were prescribed, take them as directed. Finish them even if you start to feel better. °· For the next few days, avoid physical activities that bring on chest pain. Continue physical activities as directed. °· Do not use any tobacco products, including cigarettes, chewing tobacco, or electronic cigarettes. °· Avoid drinking alcohol. °· Only take medicine as directed by your health care provider. °· Follow your health care provider's suggestions for further testing if your chest pain does not go away. °· Keep any follow-up appointments you made. If you do not go to an appointment, you could develop lasting (chronic) problems with pain. If there is any problem keeping an appointment, call to reschedule. °SEEK MEDICAL CARE IF:  °· Your chest pain does not go away, even after treatment. °· You have a rash with blisters on your chest. °· You have a fever. °SEEK IMMEDIATE MEDICAL CARE IF:  °· You have increased chest pain or pain that spreads to your arm, neck, jaw, back, or abdomen. °· You have shortness of breath. °· You have an increasing cough, or you cough   up blood.  You have severe back or abdominal pain.  You feel nauseous or vomit.  You have severe weakness.  You faint.  You have chills. This is an emergency. Do not wait to see if the pain will go away. Get medical help at once. Call your local emergency services (911 in [place] Do not drive  yourself to the hospital. MAKE SURE YOU:   Understand these instructions.  Will watch your condition.  Will get help right away if you are not doing well or get worse. Document Released: 01/23/2005 Document Revised: 04/20/2013 Document Reviewed: 11/19/2007 [facility] Patient Information 2015 [place], [place]. This information is not intended to replace advice given to you by your health care provider. Make sure you discuss any questions you have with your health care provider.  Pain of Unknown Etiology (Pain Without a Known Cause) You have come to your caregiver because of pain. Pain can occur in any part of the body. Often there is not a definite cause. If your laboratory (blood or urine) work was normal and X-rays or other studies were normal, your caregiver may treat you without knowing the cause of the pain. An example of this is the headache. Most headaches are diagnosed by taking a history. This means your caregiver asks you questions about your headaches. Your caregiver determines a treatment based on your answers. Usually testing done for headaches is normal. Often testing is not done unless there is no response to medications. Regardless of where your pain is located today, you can be given medications to make you comfortable. If no physical cause of pain can be found, most cases of pain will gradually leave as suddenly as they came.  If you have a painful condition and no reason can be found for the pain, it is important that you follow up with your caregiver. If the pain becomes worse or does not go away, it may be necessary to repeat tests and look further for a possible cause.  Only take over-the-counter or prescription medicines for pain, discomfort, or fever as directed by your caregiver.  For the protection of your privacy, test results cannot be given over the phone. Make sure you receive the results of your test. Ask how these results are to be obtained if you have not been informed.  It is your responsibility to obtain your test results.  You may continue all activities unless the activities cause more pain. When the pain lessens, it is important to gradually resume normal activities. Resume activities by beginning slowly and gradually increasing the intensity and duration of the activities or exercise. During periods of severe pain, bed rest may be helpful. Lie or sit in any position that is comfortable.  Ice used for acute (sudden) conditions may be effective. Use a large plastic bag filled with ice and wrapped in a towel. This may provide pain relief.  See your caregiver for continued problems. Your caregiver can help or refer you for exercises or physical therapy if necessary. If you were given medications for your condition, do not drive, operate machinery or power tools, or sign legal documents for 24 hours. Do not drink alcohol, take sleeping pills, or take other medications that may interfere with treatment. See your caregiver immediately if you have pain that is becoming worse and not relieved by medications. Document Released: 01/08/2001 Document Revised: 02/03/2013 Document Reviewed: 04/15/2005 [facility] Patient Information 2015 [place], [place]. This information is not intended to replace advice given to you by your health care  provider. Make sure you discuss any questions you have with your health care provider.  Panic Attacks Panic attacks are sudden, short feelings of great fear or discomfort. You may have them for no reason when you are relaxed, when you are uneasy (anxious), or when you are sleeping.  HOME CARE  Take all your medicines as told.  Check with your doctor before starting new medicines.  Keep all doctor visits. GET HELP IF:  You are not able to take your medicines as told.  Your symptoms do not get better.  Your symptoms get worse. GET HELP RIGHT AWAY IF:  Your attacks seem different than your normal attacks.  You have thoughts about  hurting yourself or others.  You take panic attack medicine and you have a side effect. MAKE SURE YOU:  Understand these instructions.  Will watch your condition.  Will get help right away if you are not doing well or get worse. Document Released: 05/18/2010 Document Revised: 02/03/2013 Document Reviewed: 11/27/2012 [facility] Patient Information 2015 [place]. This information is not intended to replace advice given to you by your health care provider. Make sure you discuss any questions you have with your health care provider.

## 2014-07-10 NOTE — ED Notes (Signed)
Pt. Left with all belongings and refused wheelchair 

## 2015-11-19 ENCOUNTER — Emergency Department (HOSPITAL_COMMUNITY)
Admission: EM | Admit: 2015-11-19 | Discharge: 2015-11-19 | Disposition: A | Discharge status: Home / Self Care | Payer: Medicaid Other | Attending: Emergency Medicine | Admitting: Emergency Medicine

## 2015-11-19 ENCOUNTER — Emergency Department (HOSPITAL_COMMUNITY): Payer: Medicaid Other

## 2015-11-19 ENCOUNTER — Encounter (HOSPITAL_COMMUNITY): Payer: Self-pay | Admitting: Emergency Medicine

## 2015-11-19 DIAGNOSIS — F329 Major depressive disorder, single episode, unspecified: Secondary | ICD-10-CM | POA: Diagnosis not present

## 2015-11-19 DIAGNOSIS — R0789 Other chest pain: Secondary | ICD-10-CM | POA: Insufficient documentation

## 2015-11-19 DIAGNOSIS — R079 Chest pain, unspecified: Secondary | ICD-10-CM

## 2015-11-19 DIAGNOSIS — F17200 Nicotine dependence, unspecified, uncomplicated: Secondary | ICD-10-CM | POA: Insufficient documentation

## 2015-11-19 LAB — BASIC METABOLIC PANEL
Anion gap: 8 (ref 5–15)
BUN: 12 mg/dL (ref 6–20)
CO2: 26 mmol/L (ref 22–32)
Calcium: 10.5 mg/dL — ABNORMAL HIGH (ref 8.9–10.3)
Chloride: 105 mmol/L (ref 101–111)
Creatinine, Ser: 0.91 mg/dL (ref 0.61–1.24)
GFR calc Af Amer: >60 mL/min (ref >60)
GLUCOSE: 102 mg/dL — AB (ref 65–99)
Potassium: 3.9 mmol/L (ref 3.5–5.1)
SODIUM: 139 mmol/L (ref 135–145)

## 2015-11-19 LAB — CBC
HCT: 42.6 % (ref 39.0–52.0)
Hemoglobin: 15.5 g/dL (ref 13.0–17.0)
MCH: 31.5 pg (ref 26.0–34.0)
MCHC: 36.4 g/dL — AB (ref 30.0–36.0)
MCV: 86.6 fL (ref 78.0–100.0)
Platelets: 184 10*3/uL (ref 150–400)
RBC: 4.92 MIL/uL (ref 4.22–5.81)
RDW: 12 % (ref 11.5–15.5)
WBC: 6.8 10*3/uL (ref 4.0–10.5)

## 2015-11-19 LAB — I-STAT TROPONIN, ED: TROPONIN I, POC: 0 ng/mL (ref 0.00–0.08)

## 2015-11-19 MED ORDER — NAPROXEN 500 MG PO TABS: 500.0000 mg | ORAL_TABLET | Freq: Two times a day (BID) | ORAL | 0 refills | Status: DC | PRN

## 2015-11-19 MED ORDER — NAPROXEN 250 MG PO TABS
500.0000 mg | ORAL_TABLET | Freq: Once | ORAL | Status: AC
  Administered 2015-11-19: 500 mg via ORAL
  Filled 2015-11-19: qty 2

## 2015-11-19 NOTE — ED Triage Notes (Signed)
Patient reports intermittent left chest pain with mild SOB and nausea for >1 month , denies diaphoresis or cough .

## 2015-11-19 NOTE — Discharge Instructions (Signed)
Follow up with primary physician. Tylenol 1000 mg every 6 hrs as needed.   If you were given medicines take as directed.  If you are on coumadin or contraceptives realize their levels and effectiveness is altered by many different medicines.  If you have any reaction (rash, tongues swelling, other) to the medicines stop taking and see a physician.    If your blood pressure was elevated in the ER make sure you follow up for management with a primary doctor or return for chest pain, shortness of breath or stroke symptoms.  Please follow up as directed and return to the ER or see a physician for new or worsening symptoms.  Thank you. Vitals:   11/19/15 1944 11/19/15 2213  BP: 115/84 133/88  Pulse: 62 65  Resp: 16 16  Temp: 98.7 F (37.1 C)   TempSrc: Oral   SpO2: 100% 100%  Weight: 155 lb 9 oz (70.6 kg)   Height: 5\' 9"  (1.753 m)

## 2015-11-19 NOTE — ED Provider Notes (Signed)
[facility]-EMERGENCY DEPT Provider Note   CSN: [id number] Arrival date & time: 11/19/15  7902  First Provider Contact:  None       History   Chief Complaint Chief Complaint  Patient presents with  . Chest Pain    HPI Vincent Bauer is a 20 y.o. male.  20 year old male with undiagnosed anxiety symptoms presents with bilateral chest discomfort and back discomfort for the past month. Worse with anxiety position. No injuries. No fevers or chills. No cough. No blood clot history, recent surgery, unilateral leg swelling. Patient recently quit smoking 10 days prior.   The history is provided by the patient.  Chest Pain   Pertinent negatives include no abdominal pain, no back pain, no fever, no headaches, no shortness of breath and no vomiting.    Past Medical History:  Diagnosis Date  . Depression     There are no active problems to display for this patient.   Past Surgical History:  Procedure Laterality Date  . TYMPANOTOMY         Home Medications    Prior to Admission medications   Medication Sig Start Date End Date Taking? Authorizing Provider  ibuprofen (ADVIL,MOTRIN) 200 MG tablet Take 600 mg by mouth every 6 (six) hours as needed (for pain.).   Yes Historical Provider, MD  naproxen (NAPROSYN) 500 MG tablet Take 1 tablet (500 mg total) by mouth 2 (two) times daily as needed. 11/19/15   Blane Ohara, MD    Family History No family history on file.  Social History Social History  Substance Use Topics  . Smoking status: Current Some Day Smoker    Types: E-cigarettes  . Smokeless tobacco: Not on file  . Alcohol use No     Allergies   Review of patient's allergies indicates no known allergies.   Review of Systems Review of Systems  Constitutional: Negative for chills and fever.  HENT: Negative for congestion.   Eyes: Negative for visual disturbance.  Respiratory: Negative for shortness of breath.   Cardiovascular: Positive for chest pain. Negative for  leg swelling.  Gastrointestinal: Negative for abdominal pain and vomiting.  Genitourinary: Negative for dysuria and flank pain.  Musculoskeletal: Negative for back pain, neck pain and neck stiffness.  Skin: Negative for rash.  Neurological: Negative for light-headedness and headaches.  Psychiatric/Behavioral: The patient is nervous/anxious.      Physical Exam Updated Vital Signs BP 114/78   Pulse (!) 58   Temp 98.7 F (37.1 C) (Oral)   Resp 19   Ht 5\' 9"  (1.753 m)   Wt 155 lb 9 oz (70.6 kg)   SpO2 100%   BMI 22.97 kg/m   Physical Exam  Constitutional: He is oriented to person, place, and time. He appears well-developed and well-nourished.  HENT:  Head: Normocephalic and atraumatic.  Eyes: Conjunctivae are normal. Right eye exhibits no discharge. Left eye exhibits no discharge.  Neck: Normal range of motion. Neck supple. No tracheal deviation present.  Cardiovascular: Normal rate, regular rhythm and intact distal pulses.   Pulmonary/Chest: Effort normal and breath sounds normal.  Abdominal: Soft. He exhibits no distension. There is no tenderness. There is no guarding.  Musculoskeletal: He exhibits no edema.  Neurological: He is alert and oriented to person, place, and time.  Skin: Skin is warm. No rash noted.  Psychiatric: He has a normal mood and affect.  Nursing note and vitals reviewed.    ED Treatments / Results  Labs (all labs ordered are listed, but only  abnormal results are displayed) Labs Reviewed  BASIC METABOLIC PANEL - Abnormal; Notable for the following:       Result Value   Glucose, Bld 102 (*)    Calcium 10.5 (*)    All other components within normal limits  CBC - Abnormal; Notable for the following:    MCHC 36.4 (*)    All other components within normal limits  I-STAT TROPOININ, ED    EKG  EKG Interpretation  Date/Time:  Sunday November 19 2015 19:41:00 EDT Ventricular Rate:  63 PR Interval:  114 QRS Duration: 88 QT Interval:  344 QTC  Calculation: 352 R Axis:   89 Text Interpretation:  Normal sinus rhythm Early repolarization Normal ECG Confirmed by Jodi Mourning MD, Deuntae Kocsis [phone]) on 11/19/2015 7:50:28 PM       Radiology Dg Chest 2 View  Result Date: 11/19/2015 CLINICAL DATA:  Left chest pain for 1 month. Shortness of breath and nausea for 1 day. EXAM: CHEST  2 VIEW COMPARISON:  PA and lateral chest 07/09/2014 and 02/17/2013. FINDINGS: The lungs are clear. Heart size is normal. There is no pneumothorax or pleural effusion. No bony abnormality. IMPRESSION: Negative chest. Electronically Signed   By: Drusilla Kanner M.D.   On: 11/19/2015 20:23   Procedures Procedures (including critical care time)  Medications Ordered in ED Medications  naproxen (NAPROSYN) tablet 500 mg (500 mg Oral Given 11/19/15 2303)     Initial Impression / Assessment and Plan / ED Course  I have reviewed the triage vital signs and the nursing notes.  Pertinent labs & imaging results that were available during my care of the patient were reviewed by me and considered in my medical decision making (see chart for details).  Clinical Course   Well-appearing patient presents with intermittent chest pain with position for the past month. Physical exam unremarkable. Cardiac screen done unremarkable except for early repolarization. Discussed possibility of pericarditis, no fever well-appearing vitals normal. Discussed NSAIDs and follow up with family doctor for outpatient echo if symptoms persist.  Results and differential diagnosis were discussed with the patient/parent/guardian. Xrays were independently reviewed by myself.  Close follow up outpatient was discussed, comfortable with the plan.   Medications  naproxen (NAPROSYN) tablet 500 mg (500 mg Oral Given 11/19/15 2303)    Vitals:   11/19/15 2213 11/19/15 2230 11/19/15 2245 11/19/15 2300  BP: 133/88 117/76 111/72 114/78  Pulse: 65 63 (!) 56 (!) 58  Resp: Temp:      TempSrc:        SpO2: 100% 100% 100% 100%  Weight:      Height:        Final diagnoses:  Nonspecific chest pain    Final Clinical Impressions(s) / ED Diagnoses   Final diagnoses:  Nonspecific chest pain    New Prescriptions New Prescriptions   NAPROXEN (NAPROSYN) 500 MG TABLET    Take 1 tablet (500 mg total) by mouth 2 (two) times daily as needed.     Blane Ohara, MD 11/19/15 [phone]

## 2018-07-07 DIAGNOSIS — M9901 Segmental and somatic dysfunction of cervical region: Secondary | ICD-10-CM | POA: Diagnosis not present

## 2018-07-07 DIAGNOSIS — M2560 Stiffness of unspecified joint, not elsewhere classified: Secondary | ICD-10-CM | POA: Diagnosis not present

## 2018-07-07 DIAGNOSIS — M542 Cervicalgia: Secondary | ICD-10-CM | POA: Diagnosis not present

## 2018-07-07 DIAGNOSIS — R293 Abnormal posture: Secondary | ICD-10-CM | POA: Diagnosis not present

## 2018-07-08 ENCOUNTER — Encounter: Payer: Self-pay | Admitting: Family Medicine

## 2018-07-08 ENCOUNTER — Ambulatory Visit: Payer: BLUE CROSS/BLUE SHIELD | Admitting: Family Medicine

## 2018-07-08 ENCOUNTER — Other Ambulatory Visit: Payer: Self-pay

## 2018-07-08 VITALS — BP 120/78 | HR 76 | Ht 69.75 in | Wt 177.4 lb

## 2018-07-08 DIAGNOSIS — Z7689 Persons encountering health services in other specified circumstances: Secondary | ICD-10-CM | POA: Diagnosis not present

## 2018-07-08 DIAGNOSIS — Z87828 Personal history of other (healed) physical injury and trauma: Secondary | ICD-10-CM | POA: Diagnosis not present

## 2018-07-08 DIAGNOSIS — R10814 Left lower quadrant abdominal tenderness: Secondary | ICD-10-CM | POA: Diagnosis not present

## 2018-07-08 DIAGNOSIS — L301 Dyshidrosis [pompholyx]: Secondary | ICD-10-CM | POA: Diagnosis not present

## 2018-07-08 MED ORDER — TRIAMCINOLONE ACETONIDE 0.1 % EX CREA: 1.0000 "application " | TOPICAL_CREAM | Freq: Two times a day (BID) | CUTANEOUS | 0 refills | Status: AC

## 2018-07-08 NOTE — Progress Notes (Signed)
Subjective:    Patient ID: Vincent Bauer, male    DOB: 01/31/1996, 23 y.o.   MRN: [id number]  HPI Chief Complaint  Patient presents with  . new pt    new pt, get established. possible hernia, and dermatitis on hands, arm and feet   He is new to the practice and here to establish care. Previous medical care: in [place] for the past 2 years.  Other providers: None   He has multiple concerns today.   Concerns that he may have a hernia. States he was lifting something heavy in December and felt something pop in his left lower abdomen. Pain immediately in his groin region. Pain persisted for about a month but spontaneously resolved.  He does report intermittent pain in his left lower abdomen at times.  Occasionally with movement.  States the area is tender.  No longer having pain into the groin. Denies fever, chills, night sweats, unexplained weight loss, chest pain, cough, shortness of breath, back pain, nausea, vomiting, diarrhea or constipation.  No urinary symptoms.  Reports history of eczema since childhood with no current flare-up. States his hands are mainly involved. Happens mostly when at work.  States he is out of the steroid cream that he uses for this condition.  He has never seen dermatology.  History of anxiety and panic attacks that are not bothersome any longer per patient.   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 States he is [place] in or [place]. Works out of town 6 weeks at a time.   Single. No kids.   Reviewed allergies, medications, past medical, surgical, family, and social history.    Review of Systems Pertinent positives and negatives in the history of present illness.     Objective:   Physical Exam Constitutional:      General: He is not in acute distress.    Appearance: Normal appearance.  HENT:     Mouth/Throat:     Mouth: Mucous membranes are moist.     Pharynx: Oropharynx is clear.  Eyes:     Conjunctiva/sclera: Conjunctivae  normal.     Pupils: Pupils are equal, round, and reactive to light.  Neck:     Musculoskeletal: Normal range of motion and neck supple.  Cardiovascular:     Rate and Rhythm: Normal rate and regular rhythm.     Pulses: Normal pulses.  Pulmonary:     Effort: Pulmonary effort is normal.     Breath sounds: Normal breath sounds.  Abdominal:     General: Abdomen is flat. Bowel sounds are normal. There is no distension.     Palpations: Abdomen is soft.     Tenderness: There is abdominal tenderness in the left lower quadrant. There is no guarding or rebound.       Comments: Questionable abdominal wall deformity vs mass. TTP No obvious hernia but I question umbilicus area if [place] is negative  Musculoskeletal: Normal range of motion.  Lymphadenopathy:     Cervical: No cervical adenopathy.  Skin:    General: Skin is warm and dry.     Capillary Refill: Capillary refill takes less than 2 seconds.     Findings: No rash.  Neurological:     Mental Status: He is alert and oriented to person, place, and time.     Cranial Nerves: Cranial nerves are intact.     Sensory: Sensation is intact.     Motor: Motor function is intact.    BP  120/78   Pulse 76   Ht 5' 9.75" (1.772 m)   Wt 177 lb 6.4 oz (80.5 kg)   SpO2 98%   BMI 25.64 kg/m       Assessment & Plan:  History of injury of abdominal wall - Plan: US Abdomen Complete  Dyshidrotic hand dermatitis - Plan: triamcinolone cream (KENALOG) 0.1 %  Encounter to establish care  Left lower quadrant abdominal tenderness without rebound tenderness - Plan: US Abdomen Complete  He is a 23 year old male who is new to the practice and here to establish care.  He has concerns regarding a recent left lower abdominal injury.  He describes a pop and pain that resolved initially but now he is having intermittent pain.  No red flag symptoms.  He is quite tender in the left lower quadrant and around his umbilicus.  I will get a ultrasound of his abdomen and  follow-up. Eczema with recurrent flares.  He does not currently have a flare in his hand exam is normal.  Triamcinolone prescribed with instructions to avoid use for longer than 1 to 2 weeks.  Recommend he see dermatology for this.

## 2018-07-09 ENCOUNTER — Telehealth: Payer: Self-pay | Admitting: Internal Medicine

## 2018-07-09 DIAGNOSIS — R293 Abnormal posture: Secondary | ICD-10-CM | POA: Diagnosis not present

## 2018-07-09 DIAGNOSIS — M542 Cervicalgia: Secondary | ICD-10-CM | POA: Diagnosis not present

## 2018-07-09 DIAGNOSIS — M2560 Stiffness of unspecified joint, not elsewhere classified: Secondary | ICD-10-CM | POA: Diagnosis not present

## 2018-07-09 DIAGNOSIS — M9901 Segmental and somatic dysfunction of cervical region: Secondary | ICD-10-CM | POA: Diagnosis not present

## 2018-07-09 NOTE — Telephone Encounter (Signed)
She will put order in for you to sign

## 2018-07-09 NOTE — Telephone Encounter (Signed)
Melinda from [facility] called for some verifcation on pts order. If looking for hernia, please place order for US pelvic limited in. If not looking at hernia, she wants to know what are you wanting to looking at.  Efraim Kaufmann can be reached at  [phone] ext [phone]

## 2018-07-09 NOTE — Telephone Encounter (Signed)
Orina from [facility] says she thinks it should be US pelvis limited since its near the umbilicus.

## 2018-07-13 ENCOUNTER — Other Ambulatory Visit: Payer: Self-pay

## 2018-07-13 ENCOUNTER — Ambulatory Visit
Admission: RE | Admit: 2018-07-13 | Discharge: 2018-07-13 | Disposition: A | Discharge status: Home / Self Care | Payer: BLUE CROSS/BLUE SHIELD | Source: Ambulatory Visit | Attending: Family Medicine | Admitting: Family Medicine

## 2018-07-13 DIAGNOSIS — R1033 Periumbilical pain: Secondary | ICD-10-CM | POA: Diagnosis not present

## 2018-07-13 DIAGNOSIS — Z87828 Personal history of other (healed) physical injury and trauma: Secondary | ICD-10-CM

## 2018-07-13 DIAGNOSIS — R10814 Left lower quadrant abdominal tenderness: Secondary | ICD-10-CM

## 2018-07-14 DIAGNOSIS — R293 Abnormal posture: Secondary | ICD-10-CM | POA: Diagnosis not present

## 2018-07-14 DIAGNOSIS — M9901 Segmental and somatic dysfunction of cervical region: Secondary | ICD-10-CM | POA: Diagnosis not present

## 2018-07-14 DIAGNOSIS — M2560 Stiffness of unspecified joint, not elsewhere classified: Secondary | ICD-10-CM | POA: Diagnosis not present

## 2018-07-14 DIAGNOSIS — M542 Cervicalgia: Secondary | ICD-10-CM | POA: Diagnosis not present

## 2018-12-10 DIAGNOSIS — Z1159 Encounter for screening for other viral diseases: Secondary | ICD-10-CM | POA: Diagnosis not present

## 2019-02-24 DIAGNOSIS — Z1159 Encounter for screening for other viral diseases: Secondary | ICD-10-CM | POA: Diagnosis not present

## 2019-05-07 DIAGNOSIS — Z20828 Contact with and (suspected) exposure to other viral communicable diseases: Secondary | ICD-10-CM | POA: Diagnosis not present

## 2019-05-14 DIAGNOSIS — Z20828 Contact with and (suspected) exposure to other viral communicable diseases: Secondary | ICD-10-CM | POA: Diagnosis not present

## 2019-05-19 DIAGNOSIS — Z20828 Contact with and (suspected) exposure to other viral communicable diseases: Secondary | ICD-10-CM | POA: Diagnosis not present

## 2019-07-25 DIAGNOSIS — Z20828 Contact with and (suspected) exposure to other viral communicable diseases: Secondary | ICD-10-CM | POA: Diagnosis not present

## 2019-08-13 ENCOUNTER — Ambulatory Visit: Payer: BC Managed Care – PPO | Attending: Internal Medicine

## 2019-08-13 DIAGNOSIS — Z23 Encounter for immunization: Secondary | ICD-10-CM

## 2019-08-13 NOTE — Progress Notes (Signed)
   Covid-19 Vaccination Clinic  Name:  Vincent Bauer    MRN: [id number] DOB: 26-Feb-1996  08/13/2019  [name] was observed post Covid-19 immunization for 15 minutes without incident. He was provided with Vaccine Information Sheet and instruction to access the V-Safe system.   [name] was instructed to call 911 with any severe reactions post vaccine: Marland Kitchen Difficulty breathing  . Swelling of face and throat  . A fast heartbeat  . A bad rash all over body  . Dizziness and weakness   Immunizations Administered    Name Date Dose VIS Date Route   Pfizer COVID-19 Vaccine 08/13/2019  1:19 PM 0.3 mL 04/09/2019 Intramuscular   Manufacturer: [employer], [employer]   Lot: [id number]   NDC: [address]-4

## 2019-08-16 DIAGNOSIS — Z03818 Encounter for observation for suspected exposure to other biological agents ruled out: Secondary | ICD-10-CM | POA: Diagnosis not present

## 2019-08-27 ENCOUNTER — Telehealth: Payer: Self-pay | Admitting: Family Medicine

## 2019-08-27 NOTE — Telephone Encounter (Signed)
Pt called and states that he needs a referral to a orthopedic surgeron states he this was work related and it happened back on Jan 3rd 2020, and is Is for a a hernia, and pain in the groin area, pt is aware that we do not bill third party insurance and we do not do works com pension. states he went to the [facility] and they are requesting a referral from his pcp, states his job sent him to the those dr, pt can be reached at [phone] and informed pt that you was out of the office today

## 2019-08-30 NOTE — Telephone Encounter (Signed)
Can you check on this? He did not tell me it was a work related issue. Thanks.

## 2019-08-30 NOTE — Telephone Encounter (Signed)
Called pt he states this was a work related incident. 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  I explained if it was an injury while working we cannot be a part that his employer will need to give him direction.  He understood.

## 2019-09-06 ENCOUNTER — Ambulatory Visit: Payer: BC Managed Care – PPO | Attending: Internal Medicine

## 2019-09-06 DIAGNOSIS — Z23 Encounter for immunization: Secondary | ICD-10-CM

## 2019-09-13 ENCOUNTER — Ambulatory Visit: Payer: BC Managed Care – PPO

## 2020-06-26 ENCOUNTER — Other Ambulatory Visit: Payer: Self-pay

## 2020-06-26 ENCOUNTER — Ambulatory Visit (INDEPENDENT_AMBULATORY_CARE_PROVIDER_SITE_OTHER): Payer: BC Managed Care – PPO | Admitting: Sports Medicine

## 2020-06-26 VITALS — BP 118/74 | Ht 69.0 in | Wt 175.0 lb

## 2020-06-26 DIAGNOSIS — S161XXA Strain of muscle, fascia and tendon at neck level, initial encounter: Secondary | ICD-10-CM

## 2020-06-26 DIAGNOSIS — M792 Neuralgia and neuritis, unspecified: Secondary | ICD-10-CM

## 2020-06-27 NOTE — Progress Notes (Addendum)
   Subjective:    Patient ID: Vincent Bauer, male    DOB: 02/28/1996, 25 y.o.   MRN: [id number]  HPI chief complaint: Neck pain, back pain, leg pain  25 year old male comes in today complaining of diffuse pain throughout his body which began in mid September. He states that all of his symptoms began when he stopped drinking alcohol. He went through a period of significant withdrawal after which he began to experience twinges in his legs. The twinges then migrated into his low back, neck, and shoulders. He describes it as a tingling type sensation and it is accompanied with headaches. He has seen neurology and had a work-up consisting of an MRI of his brain, MRI of his C-spine, and a CT scan of his neck. Other than some congenital vascular changes and some small bulging discs in his cervical spine, the work-up has been negative per his report. He is scheduled to undergo EMG/nerve conduction studies in May. In addition, he has been placed on Cymbalta and lorazepam which have been helpful but not curative. He is also on 400 mg of gabapentin twice daily. This is also been somewhat helpful but again not curative. Remote x-rays of his lumbar spine in 2014 were unremarkable.  Past medical history reviewed Medications reviewed Allergies reviewed    Review of Systems    Above Objective:   Physical Exam  Well-developed, well-nourished. No acute distress. Sitting comfortably in the exam room  Examination of the cervical spine shows full range of motion. No significant tenderness to palpation. No spasm. Neurological exam shows no atrophy. Reflexes are equal at the biceps, triceps, and brachioradialis tendons in the upper extremities and equal in the patellar and Achilles tendons in the lower extremities. Good pulses. No clonus.      Assessment & Plan:   Diffuse neuralgias of unknown etiology  I think it is important for [name] to continue with his treatment with neurology. I would encourage him  to proceed with the EMG/nerve conduction studies as scheduled in May. I have also asked him to speak with his neurologist about increasing his gabapentin. I do think a brief trial of physical therapy may be helpful. I am happy to see him again after his nerve conduction studies are completed.

## 2020-07-12 ENCOUNTER — Other Ambulatory Visit: Payer: Self-pay

## 2020-07-12 ENCOUNTER — Ambulatory Visit: Payer: BC Managed Care – PPO | Attending: Sports Medicine | Admitting: Physical Therapy

## 2020-07-12 ENCOUNTER — Encounter: Payer: Self-pay | Admitting: Physical Therapy

## 2020-07-12 DIAGNOSIS — R293 Abnormal posture: Secondary | ICD-10-CM | POA: Insufficient documentation

## 2020-07-12 DIAGNOSIS — M542 Cervicalgia: Secondary | ICD-10-CM | POA: Diagnosis present

## 2020-07-12 DIAGNOSIS — R202 Paresthesia of skin: Secondary | ICD-10-CM | POA: Diagnosis present

## 2020-07-12 DIAGNOSIS — M62838 Other muscle spasm: Secondary | ICD-10-CM | POA: Diagnosis present

## 2020-07-12 NOTE — Therapy (Addendum)
[facility] [address]  [place], [place], [address] Phone: [phone]   Fax:  [phone]  Physical Therapy Evaluation  Patient Details  Name: CALIB WADHWA MRN: [id number] Date of Birth: [birth year] Referring Provider (PT): Lilia Argue, MD   Encounter Date: 07/12/2020   PT End of Session - 07/12/20 1729    Visit Number 1    Number of Visits 7    Date for PT Re-Evaluation 08/30/20    Authorization Type [employer]    PT Start Time 1451    PT Stop Time 1532    PT Time Calculation (min) 41 min    Activity Tolerance Patient tolerated treatment well;Patient limited by pain    Behavior During Therapy Flat affect           Past Medical History:  Diagnosis Date  . Depression   . Dyshidrotic hand dermatitis     Past Surgical History:  Procedure Laterality Date  . TYMPANOTOMY      There were no vitals filed for this visit.    Subjective Assessment - 07/12/20 1452    Subjective Patient reports issues with his neck since September 2021. Reports that it started when he was having a drug withdrawal- electrical sensations up his spine, finally reaching his head. Had paresthesias, neck and head aches. Pain occlude from the neck down the arm, with N/T, and muscle jerks. Now that he is on medication for this, only his neck pain with radiation down the arm to the elbow remains. N/T occurs down to the hand. Worse with activities that increase his heart rate- running or heavy movement, B sidelying, tilting head up/down, rotation. Better with Gabapentin, cold shower.    Pertinent History depression    Limitations Sitting;Reading;Lifting;House hold activities    Diagnostic tests from MD's note: "Other than some congenital vascular changes and some small bulging discs in his cervical spine, the work-up has been negative per his report."    Patient Stated Goals decrease pain    Currently in Pain? Yes    Pain Score 7      Pain Location Neck    Pain Orientation Left    Pain Descriptors / Indicators Sharp    Pain Type Chronic pain;Neuropathic pain    Pain Radiating Towards L shoulder              [facility] PT Assessment - 07/12/20 1458      Assessment   Medical Diagnosis Strain of neck muscle    Referring Provider (PT) Lilia Argue, MD    Onset Date/Surgical Date 12/29/19    Hand Dominance Left    Next MD Visit pt unsure    Prior Therapy yes      Precautions   Precautions None      Balance Screen   Has the patient fallen in the past 6 months No    Has the patient had a decrease in activity level because of a fear of falling?  No      Home Environment   Living Environment Private residence    Living Arrangements Other relatives    Available Help at Discharge Family    Type of [place]      Prior Function   Level of Independence Independent    Vocation Full time employment   on medical leave   Editor, commissioning- heavy lifting, working at odd angles, prolonged sitting/standing, squatting, kneeling    Leisure kayaking, hiking  Cognition   Overall Cognitive Status Within Functional Limits for tasks assessed      Sensation   Light Touch Appears Intact      Coordination   Gross Motor Movements are Fluid and Coordinated Yes      Posture/Postural Control   Posture/Postural Control Postural limitations    Postural Limitations Rounded Shoulders;Forward head;Posterior pelvic tilt    Posture Comments L shoulder depressed, head tilted to R      ROM / Strength   AROM / PROM / Strength AROM;Strength      AROM   AROM Assessment Site Cervical    Cervical Flexion 46   mild pain   Cervical Extension 45    Cervical - Right Side Bend 27   stiff   Cervical - Left Side Bend 33   stiff   Cervical - Right Rotation 46   severe pain   Cervical - Left Rotation 49      Strength   Strength Assessment Site Shoulder;Elbow;Wrist;Hand    Right/Left Shoulder Right;Left    Right  Shoulder Flexion 4+/5    Right Shoulder ABduction 4+/5    Right Shoulder Internal Rotation 4/5    Right Shoulder External Rotation 4/5    Left Shoulder Flexion 4+/5    Left Shoulder ABduction 4+/5   pain   Left Shoulder Internal Rotation 4/5    Left Shoulder External Rotation 4/5   pain   Right/Left Elbow Right;Left    Right Elbow Flexion 4+/5    Right Elbow Extension 4+/5    Left Elbow Flexion 4+/5    Left Elbow Extension 4+/5    Right/Left Wrist Right;Left    Right Wrist Flexion 4+/5    Right Wrist Extension 4+/5    Left Wrist Radial Deviation 4+/5    Left Wrist Ulnar Deviation 4+/5    Right/Left hand Right;Left    Right Hand Grip (lbs) 50   60, 50, 40   Left Hand Grip (lbs) 49.33   55, 50, 43     Palpation   Palpation comment TTP in L UT, LS, rhomboids, cervical paraspinals; TTP and increased restriction in L pec and proximal biceps tendon      Special Tests    Special Tests Cervical    Cervical Tests Spurling's;Dictraction      Spurling's   Findings Positive    Side Right    Comment c/o slightly increased pain      Distraction Test   Findngs Positive    Comment report of slightly improved symptoms                      Objective measurements completed on examination: See above findings.       [facility] Adult PT Treatment/Exercise - 07/12/20 1458      Exercises   Exercises Shoulder      Shoulder Exercises: Stretch   Corner Stretch 1 rep;30 seconds   L arm went pale and pt reporting feeling of "no blood" in his hand                 PT Education - 07/12/20 1728    Education Details prognosis, POC, HEP    Person(s) Educated Patient    Methods Explanation;Demonstration;Tactile cues;Verbal cues;Handout    Comprehension Verbalized understanding;Returned demonstration            PT Short Term Goals - 07/12/20 1734      PT SHORT TERM GOAL #1   Title Patient to be independent  with initial HEP.    Time 3    Period Weeks    Status New     Target Date 08/02/20             PT Long Term Goals - 07/12/20 1734      PT LONG TERM GOAL #1   Title Patient to be independent with advanced HEP.    Time 6    Period Weeks    Status New    Target Date 08/23/20      PT LONG TERM GOAL #2   Title Patient to demonstrate cervical AROM WFL and without pain limiting.    Time 6    Period Weeks    Status New    Target Date 08/23/20      PT LONG TERM GOAL #3   Title Patient to demonstrate proper postural correction at rest and with activity.    Time 6    Period Weeks    Status New    Target Date 08/23/20      PT LONG TERM GOAL #4   Title Patient to return to outdoor activities without pain/paresthesias limiting.    Time 6    Period Weeks    Status New    Target Date 08/23/20      PT LONG TERM GOAL #5   Title Patient to report 85% improvement in neck/arm pain.    Time 6    Period Weeks    Status New    Target Date 08/23/20                  Plan - 07/12/20 1729    Clinical Impression Statement Patient is a 25 y/o M presenting to [facility] with c/o neck pain radiating down the L UE since September 2021 when he was going through a drug withdrawal. Notes improvement in symptoms with medication, however with remaining pain radiating down to the elbow and N/T down to the hand.  Worse with activities that increase his heart rate- running or heavy movement, B sidelying, tilting head up/down, rotation. Patient today presenting with rounded shoulders, depressed L shoulder, with head tilted to the R, painful and limited cervical AROM, mild B shoulder weakness but otherwise good B UE strength, TTP in L UT, LS, rhomboids, cervical paraspinals and TTP and increased restriction in L pec and proximal biceps tendon, possible positive R Spurling's and distraction tests. When attempting corner pec stretch, patient's L hand became pale and patient reporting sensation of lack of blood flow in the L hand. Thus suggests possible TOS as cause of  patient's symptoms. Patient was educated on gentle postural correction HEP- patient reported understanding. Would benefit from skilled PT services 1x/week for 6 weeks to address aforementioned impairments.    Personal Factors and Comorbidities Age;Comorbidity 1;Fitness;Past/Current Experience;Profession;Time since onset of injury/illness/exacerbation    Comorbidities depression    Examination-Activity Limitations Sleep;Bed Mobility;Sit;Bend;Carry;Dressing;Transfers;Hygiene/Grooming;Lift;Reach Overhead;Self Feeding    Examination-Participation Restrictions Church;Cleaning;Shop;Community Activity;Driving;Yard Work;Laundry;Meal Prep;Occupation    Stability/Clinical Decision Making Evolving/Moderate complexity    Clinical Decision Making Moderate    Rehab Potential Good    PT Frequency 1x / week    PT Duration 6 weeks    PT Treatment/Interventions ADLs/Self Care Home Management;Cryotherapy;Electrical Stimulation;Iontophoresis 50m/ml Dexamethasone;Moist Heat;Traction;Therapeutic exercise;Therapeutic activities;Functional mobility training;Ultrasound;Neuromuscular re-education;Patient/family education;Manual techniques;Vasopneumatic Device;Taping;Energy conservation;Dry needling;Passive range of motion    PT Next Visit Plan cervical FOTO; reassess HEP; STM to L scalenes/pecs? progress cervical and stretching ROM to tolerance    Consulted and Agree with Plan of  Care Patient           Patient will benefit from skilled therapeutic intervention in order to improve the following deficits and impairments:  Hypomobility,Decreased activity tolerance,Decreased strength,Increased fascial restricitons,Impaired UE functional use,Pain,Difficulty walking,Increased muscle spasms,Improper body mechanics,Decreased range of motion,Postural dysfunction,Impaired flexibility  Visit Diagnosis: Cervicalgia  Paresthesia of skin  Abnormal posture  Other muscle spasm     Problem List Patient Active Problem List    Diagnosis Date Noted  . History of injury of abdominal wall 07/08/2018  . Dyshidrotic hand dermatitis      Janene Harvey, PT, DPT 07/12/20 5:38 PM   [facility] [address]  [place], [place], [address] Phone: [phone]   Fax:  [phone]  Name: SRIYAN CUTTING MRN: [id number] Date of Birth: 12/28/1995   PHYSICAL THERAPY DISCHARGE SUMMARY  Visits from Start of Care: 1  Current functional level related to goals / functional outcomes: See above clinical impression; patient was put on hold d/t vascular symptoms   Remaining deficits: See above   Education / Equipment: HEP  Plan: Patient agrees to discharge.  Patient goals were not met. Patient is being discharged due to the physician's request.  ?????     Janene Harvey, PT, DPT 08/16/20 11:48 AM

## 2020-07-24 ENCOUNTER — Ambulatory Visit: Payer: BC Managed Care – PPO | Admitting: Physical Therapy

## 2020-07-26 ENCOUNTER — Ambulatory Visit: Payer: BC Managed Care – PPO | Admitting: Physical Therapy

## 2020-08-14 ENCOUNTER — Ambulatory Visit: Payer: BC Managed Care – PPO | Admitting: Sports Medicine

## 2020-09-05 ENCOUNTER — Other Ambulatory Visit: Payer: Self-pay

## 2020-09-05 ENCOUNTER — Ambulatory Visit (INDEPENDENT_AMBULATORY_CARE_PROVIDER_SITE_OTHER): Payer: BC Managed Care – PPO | Admitting: Sports Medicine

## 2020-09-05 VITALS — BP 102/70 | Ht 69.0 in | Wt 175.0 lb

## 2020-09-05 DIAGNOSIS — G540 Brachial plexus disorders: Secondary | ICD-10-CM

## 2020-09-05 NOTE — Patient Instructions (Signed)
The Cardiovascular office will call you with an appointment.

## 2020-09-06 NOTE — Progress Notes (Addendum)
   Subjective:    Patient ID: Vincent Bauer, male    DOB: Aug 08, 1995, 25 y.o.   MRN: [id number]  HPI   Patient comes in today for reevaluation after his physical therapist was concerned about possible thoracic outlet syndrome of the left arm.  While performing shoulder exercises and physical therapy, it was noted that he lost distal pulses and was complaining of numbness and skin color changes.  Patient states this has been going on intermittently for several years.  He traces his symptoms back to an injury he had in 2015 to his left upper extremity.  He is currently undergoing neurological work-up to exclude neuropathic causes of pain including a scheduled EMG/nerve conduction study next week.  He has not returned to physical therapy.    Review of Systems    As above Objective:   Physical Exam   Well developed, well nourished.  No acute distress  Examination of the patient's neck shows full cervical range of motion.  Left upper extremity shows full active and passive range of motion at the shoulder.  Patient does have a positive Nydia Bouton but an equivocal Adson's.      Assessment & Plan:   Chronic left upper extremity pain-question thoracic outlet syndrome  Patient is referred to vascular surgery for possible work-up of thoracic outlet syndrome.  I have also encouraged him to follow-up with neurology next week for his EMG/nerve conduction study.  He will hold on physical therapy until consultation with vascular surgery is obtained and results of his EMG/nerve conduction study are known.  Patient is in agreement with this plan.

## 2020-12-07 ENCOUNTER — Ambulatory Visit (INDEPENDENT_AMBULATORY_CARE_PROVIDER_SITE_OTHER): Payer: Worker's Compensation | Admitting: Sports Medicine

## 2020-12-07 ENCOUNTER — Other Ambulatory Visit: Payer: Self-pay

## 2020-12-07 VITALS — BP 108/72 | Ht 69.0 in | Wt 170.0 lb

## 2020-12-07 DIAGNOSIS — R1032 Left lower quadrant pain: Secondary | ICD-10-CM | POA: Diagnosis not present

## 2020-12-07 NOTE — Progress Notes (Signed)
   Subjective:    Patient ID: Vincent Bauer, male    DOB: 1996/04/19, 25 y.o.   MRN: [id number]  HPI chief complaint: Left lower abdominal pain  Vincent Bauer comes in today to discuss chronic left lower abdominal pain.  He injured himself at work in January 2020.  Injury occurred when he was pulling on a very heavy valve.  Felt a pop in the lower left abdomen and since then has had pain in this area as well as in the groin, low back, and diffusely around the hip.  He has seen multiple providers and had several imaging studies and treatments which have varied in their success.  He underwent a cortisone injection into the left adductor tendon which seemed to resolve pain in this area.  He most recently underwent a spinal injection with neurology which helped resolve some of his low back pain.  His main area of tenderness remains the left lower abdomen.  However he also continues to endorse diffuse pain around the left hip.  He is currently out of work on medical leave because of this issue.  Interim medical history reviewed Medications reviewed Allergies reviewed    Review of Systems As above    Objective:   Physical Exam  Well-developed, well-nourished.  No acute distress  Physical examination shows tenderness to palpation along the left lower abdomen, specifically along the left distal rectus abdominis.  There is no palpable defect.  No pain with resisted sit up although he does endorse an achy discomfort if he does multiple sit ups.       Assessment & Plan:   Chronic lower left-sided abdominal pain  Prior to the patient's visit, I received an extensive amount of medical records from [facility].  Included in this packet were doctors office visit notes, procedure notes, and imaging studies.  I thoroughly reviewed these records and I have reassured Vincent Bauer that I do not think he has any sort of abdominal hernia or sports hernia based on my physical examination and review of those  notes.  I do think he may benefit from compression such as with a hernia belt when needed (he has had good success with this in the past).  At this point I would recommend no further work-up or treatment for this issue.  Follow-up with me as needed.

## 2020-12-14 ENCOUNTER — Ambulatory Visit: Payer: BC Managed Care – PPO | Admitting: Physical Therapy

## 2020-12-14 ENCOUNTER — Other Ambulatory Visit: Payer: Self-pay

## 2020-12-20 ENCOUNTER — Other Ambulatory Visit: Payer: Self-pay

## 2020-12-20 ENCOUNTER — Ambulatory Visit
Payer: BC Managed Care – PPO | Attending: Thoracic Surgery (Cardiothoracic Vascular Surgery) | Admitting: Physical Therapy

## 2020-12-20 ENCOUNTER — Encounter: Payer: Self-pay | Admitting: Physical Therapy

## 2020-12-20 DIAGNOSIS — M542 Cervicalgia: Secondary | ICD-10-CM

## 2020-12-20 DIAGNOSIS — R202 Paresthesia of skin: Secondary | ICD-10-CM | POA: Diagnosis present

## 2020-12-20 DIAGNOSIS — M62838 Other muscle spasm: Secondary | ICD-10-CM

## 2020-12-20 DIAGNOSIS — R293 Abnormal posture: Secondary | ICD-10-CM

## 2020-12-20 NOTE — Therapy (Signed)
[facility] [address] [place], [place], [address] Phone: [phone]   Fax:  [phone]  Physical Therapy Evaluation  Patient Details  Name: Vincent Bauer MRN: [id number] Date of Birth: 04-20-1996 Referring Provider (PT): Danella Maiers, MD   Encounter Date: 12/20/2020   PT End of Session - 12/20/20 1740     Visit Number 1    Number of Visits 13    Date for PT Re-Evaluation 01/31/21    Authorization Type An[employer]    PT Start Time 1700    PT Stop Time 1735    PT Time Calculation (min) 35 min    Activity Tolerance Patient tolerated treatment well;Patient limited by pain    Behavior During Therapy [facility] for tasks assessed/performed;Flat affect             Past Medical History:  Diagnosis Date   Depression    Dyshidrotic hand dermatitis     Past Surgical History:  Procedure Laterality Date   TYMPANOTOMY      There were no vitals filed for this visit.    Subjective Assessment - 12/20/20 1659     Subjective Patient reports that he saw a cardiothoracic surgeon at [facility] who believes that he is a candidate for surgery for thoracic outlet syndrome. Would like him to work on some exercises for his shoulder before he has his surgery. Notes that he is having HAs and burning/stinging nerve pain from the L side of the head down the shoulder and L arm to the tip of the pinky. Worse with reaching, lifting. Bouts of circulation loss, especially when the arm is tucked into the side or out to the side. Sometimes the L arm with go numb in sidelying. Notes that symptoms are about the same as first onset in September 2021.    Pertinent History depression    Limitations Reading;Lifting;House hold activities    Diagnostic tests 12/05/20 MRI/MRA of UE: No discernible abnormality to suggest thoracic outlet syndrome. Narrowing of the costoclavicular space with arms up resulting in trace  impress on the undersurface of the  subclavian artery without significant  stenosis. Narrowing of the subclavian vein at the level of the  costoclavicular space with arms up is likely physiologic. No thrombosis or  neovascular collaterals to suggest chronic stenosis.    Patient Stated Goals improve chances of recovery from surgery    Currently in Pain? Yes    Pain Score 7    6.5   Pain Location Neck    Pain Orientation Left    Pain Descriptors / Indicators Sharp    Pain Type Chronic pain;Neuropathic pain    Pain Radiating Towards L shoulder                [facility] PT Assessment - 12/20/20 1707       Assessment   Medical Diagnosis Pain and numbness of L UE    Referring Provider (PT) Danella Maiers, MD    Onset Date/Surgical Date 12/29/19    Hand Dominance Left    Next MD Visit 01/21/21    Prior Therapy yes- eval only      Precautions   Precautions None      Balance Screen   Has the patient fallen in the past 6 months No    Has the patient had a decrease in activity level because of a fear of falling?  No    Is the patient reluctant to leave their home because  of a fear of falling?  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, working at odd angles, prolonged sitting/standing, squatting, kneeling    Leisure kayaking, hiking      Cognition   Overall Cognitive Status Within Functional Limits for tasks assessed      Sensation   Light Touch Impaired by gross assessment   reports decreased temperature sensation on L UE     Coordination   Gross Motor Movements are Fluid and Coordinated Yes      Posture/Postural Control   Posture/Postural Control Postural limitations    Postural Limitations Rounded Shoulders;Forward  head;Posterior pelvic tilt    Posture Comments B shoulders severely rounded with increased cervical lordosis      AROM   Cervical Flexion 28   neck pain   Cervical Extension 35   neck pain   Cervical - Right Side Bend 21   more pain   Cervical - Left Side Bend 25   pain   Cervical - Right Rotation 46   severe pain   Cervical - Left Rotation 51   severe pain     Strength   Right Shoulder Flexion 4/5    Right Shoulder ABduction 4/5    Right Shoulder Internal Rotation 4+/5    Right Shoulder External Rotation 4+/5    Left Shoulder Flexion 4/5    Left Shoulder ABduction 4/5    Left Shoulder Internal Rotation 4+/5    Left Shoulder External Rotation 4+/5    Right Elbow Flexion 4+/5    Right Elbow Extension 4+/5    Left Elbow Flexion 4+/5    Left Elbow Extension 4+/5    Right Wrist Flexion 4+/5    Right Wrist Extension 4+/5    Left Wrist Flexion 4+/5    Left Wrist Extension 4/5    Right Hand Grip (lbs) 74.3   65, 80, 78   Left Hand Grip (lbs) 86.66   80, 90, 90     Palpation   Palpation comment grossly tight in B neck and shoulders; TTP over L pec, proximal biceps tendon, UT, cervical paraspinals and suboccipitals      Spurling's   Findings Positive    Side Left    Comment c/o slightly increased pain                        Objective measurements completed on examination: See above findings.               PT Education - 12/20/20 1740     Education Details prognosis, POC, HEP    Person(s) Educated Patient    Methods Explanation;Demonstration;Tactile cues;Verbal cues;Handout    Comprehension Verbalized understanding;Returned demonstration              PT Short Term Goals - 12/20/20 1746       PT SHORT TERM GOAL #1   Title Patient to be independent with initial HEP.    Time 3    Period Weeks    Status New    Target Date 01/10/21  PT Long Term Goals - 12/20/20 1746       PT LONG TERM GOAL #1   Title Patient to be  independent with advanced HEP.    Time 6    Period Weeks    Status New    Target Date 01/31/21      PT LONG TERM GOAL #2   Title Patient to demonstrate cervical AROM WFL and without pain limiting.    Time 6    Period Weeks    Status New    Target Date 01/31/21      PT LONG TERM GOAL #3   Title Patient to demonstrate proper postural correction at rest and with activity.    Time 6    Period Weeks    Status New    Target Date 01/31/21      PT LONG TERM GOAL #4   Title Patient to return to outdoor activities without pain/paresthesias limiting.    Time 6    Period Weeks    Status New    Target Date 01/31/21      PT LONG TERM GOAL #5   Title Patient to report 85% improvement in neck/arm pain.    Time 6    Period Weeks    Status New    Target Date 01/31/21                    Plan - 12/20/20 1741     Clinical Impression Statement Patient is a 25 y/o M presenting to OPPT with c/o neck pain radiating down the L UE since September 2021 without inciting injury. Patient reports that he anticipates surgery for thoracic outlet syndrome, however recent work-up included MRA/MRI of the L UE which revealed "no discernible abnormality to suggest thoracic outlet syndrome." L shoulder MRI showed mild distal infraspinatus tendinopathy. Patient reports HAs and pain starting from L superior neck, radiating down the shoulder to the 5th digit. Notes intermittent N/T when in L sidelying and bouts of "circulation loss" with the shoulder in adduction or abduction. Pain is aggravated by lifting and reaching. Patient today presenting with significantly rounded shoulders and cervical lordosis, limited and painful cervical AROM, decreased B shoulder strength, positive L Spurling's, TTP over L pec, proximal biceps tendon, UT, cervical paraspinals and suboccipitals. Patient was educated on gentle postural correction HEP and reported understanding. Would benefit from skilled PT services 2x/week for 6  weeks to address aforementioned impairments.    Personal Factors and Comorbidities Age;Comorbidity 1;Fitness;Past/Current Experience;Profession;Time since onset of injury/illness/exacerbation    Comorbidities depression    Examination-Activity Limitations Sleep;Bed Mobility;Sit;Bend;Carry;Dressing;Transfers;Hygiene/Grooming;Lift;Reach Overhead;Self Feeding    Examination-Participation Restrictions Church;Cleaning;Shop;Community Activity;Driving;Yard Work;Laundry;Meal Prep;Occupation    Stability/Clinical Decision Making Evolving/Moderate complexity    Clinical Decision Making Moderate    Rehab Potential Good    PT Frequency 2x / week    PT Duration 6 weeks    PT Treatment/Interventions ADLs/Self Care Home Management;Cryotherapy;Electrical Stimulation;Iontophoresis 4mg /ml Dexamethasone;Moist Heat;Traction;Therapeutic exercise;Therapeutic activities;Functional mobility training;Ultrasound;Neuromuscular re-education;Patient/family education;Manual techniques;Vasopneumatic Device;Taping;Energy conservation;Dry needling;Passive range of motion    PT Next Visit Plan cervical FOTO; reassess HEP; STM to L scalenes/pecs? progress cervical and stretching ROM to tolerance    Consulted and Agree with Plan of Care Patient             Patient will benefit from skilled therapeutic intervention in order to improve the following deficits and impairments:  Hypomobility, Decreased activity tolerance, Decreased strength, Increased fascial restricitons, Impaired UE functional use, Pain, Increased muscle spasms, Improper body mechanics,  Decreased range of motion, Postural dysfunction, Impaired flexibility, Impaired sensation  Visit Diagnosis: Cervicalgia  Paresthesia of skin  Abnormal posture  Other muscle spasm     Problem List Patient Active Problem List   Diagnosis Date Noted   History of injury of abdominal wall 07/08/2018   Dyshidrotic hand dermatitis      Anette Guarneri, PT,  DPT 12/20/20 5:47 PM    [facility] [address] [place], [place], [address] Phone: [phone]   Fax:  [phone]  Name: KELON EASOM MRN: [id number] Date of Birth: [birth year]

## 2020-12-25 ENCOUNTER — Other Ambulatory Visit: Payer: Self-pay

## 2020-12-25 ENCOUNTER — Ambulatory Visit: Payer: BC Managed Care – PPO | Admitting: Physical Therapy

## 2020-12-25 ENCOUNTER — Encounter: Payer: Self-pay | Admitting: Physical Therapy

## 2020-12-25 DIAGNOSIS — M542 Cervicalgia: Secondary | ICD-10-CM

## 2020-12-25 DIAGNOSIS — R202 Paresthesia of skin: Secondary | ICD-10-CM

## 2020-12-25 DIAGNOSIS — R293 Abnormal posture: Secondary | ICD-10-CM

## 2020-12-25 NOTE — Therapy (Signed)
[facility] [address] [place], [place], [address] Phone: [phone]   Fax:  [phone]  Physical Therapy Treatment  Patient Details  Name: Vincent Bauer MRN: [id number] Date of Birth: [birth year] Referring Provider (PT): Danella Maiers, MD   Encounter Date: 12/25/2020   PT End of Session - 12/25/20 1634     Visit Number 2    Number of Visits 13    Date for PT Re-Evaluation 01/31/21    Authorization Type [employer]    PT Start Time 1558    PT Stop Time 1635    PT Time Calculation (min) 37 min    Activity Tolerance Patient tolerated treatment well    Behavior During Therapy [facility] for tasks assessed/performed;Flat affect             Past Medical History:  Diagnosis Date   Depression    Dyshidrotic hand dermatitis     Past Surgical History:  Procedure Laterality Date   TYMPANOTOMY      There were no vitals filed for this visit.   Subjective Assessment - 12/25/20 1600     Subjective Pt reports that he had been swimming and [place]", tension in L shoulder up into neck giving headaches at times    Pertinent History depression    Limitations Reading;Lifting;House hold activities    Currently in Pain? Yes    Pain Score 6     Pain Location --   L temple into L shoulder                              [facility] Adult PT Treatment/Exercise - 12/25/20 0001       Exercises   Exercises Shoulder      Shoulder Exercises: Standing   Horizontal ABduction Strengthening;Both;20 reps;Theraband    Theraband Level (Shoulder Horizontal ABduction) Level 3 (Green)    External Rotation Both;Strengthening;20 reps;Theraband    Theraband Level (Shoulder External Rotation) Level 3 (Green)    Flexion Strengthening;Both;20 reps;Theraband;Weights    Shoulder Flexion Weight (lbs) 4    ABduction Strengthening;Both;20 reps;Weights    Shoulder ABduction Weight (lbs) 3    Extension  Strengthening;Both;20 reps;Weights    Extension Weight (lbs) 10    Other Standing Exercises AAROM 3lb WaTE bar flex, ext, IR up back x 10 each      Shoulder Exercises: ROM/Strengthening   UBE (Upper Arm Bike) L2 x 3 min each    Other ROM/Strengthening Exercises Rows & Lats 25lb 2x10      Shoulder Exercises: Stretch   Corner Stretch 3 reps;10 seconds                      PT Short Term Goals - 12/20/20 1746       PT SHORT TERM GOAL #1   Title Patient to be independent with initial HEP.    Time 3    Period Weeks    Status New    Target Date 01/10/21               PT Long Term Goals - 12/20/20 1746       PT LONG TERM GOAL #1   Title Patient to be independent with advanced HEP.    Time 6    Period Weeks    Status New    Target Date 01/31/21      PT LONG TERM GOAL #2  Title Patient to demonstrate cervical AROM WFL and without pain limiting.    Time 6    Period Weeks    Status New    Target Date 01/31/21      PT LONG TERM GOAL #3   Title Patient to demonstrate proper postural correction at rest and with activity.    Time 6    Period Weeks    Status New    Target Date 01/31/21      PT LONG TERM GOAL #4   Title Patient to return to outdoor activities without pain/paresthesias limiting.    Time 6    Period Weeks    Status New    Target Date 01/31/21      PT LONG TERM GOAL #5   Title Patient to report 85% improvement in neck/arm pain.    Time 6    Period Weeks    Status New    Target Date 01/31/21                   Plan - 12/25/20 1636     Clinical Impression Statement Pt enters clinic stating that he has been [place] the [place] and tolerating the pain. Introduced pt to some postural strengthening interventions. Cue throughout session to reinforce appropriate posture. Some shoulder fatigue and burning with flexion and horizontal abduction. Pt reports seeing his MRI results and saying he may not have TOS but reports that he has told  he may still have it considering symptoms and past injury.    Personal Factors and Comorbidities Age;Comorbidity 1;Fitness;Past/Current Experience;Profession;Time since onset of injury/illness/exacerbation    Comorbidities depression    Examination-Activity Limitations Sleep;Bed Mobility;Sit;Bend;Carry;Dressing;Transfers;Hygiene/Grooming;Lift;Reach Overhead;Self Feeding    Examination-Participation Restrictions Church;Cleaning;Shop;Community Activity;Driving;Yard Work;Laundry;Meal Prep;Occupation    Stability/Clinical Decision Making Evolving/Moderate complexity    Rehab Potential Good    PT Frequency 2x / week    PT Treatment/Interventions ADLs/Self Care Home Management;Cryotherapy;Electrical Stimulation;Iontophoresis 4mg /ml Dexamethasone;Moist Heat;Traction;Therapeutic exercise;Therapeutic activities;Functional mobility training;Ultrasound;Neuromuscular re-education;Patient/family education;Manual techniques;Vasopneumatic Device;Taping;Energy conservation;Dry needling;Passive range of motion    PT Next Visit Plan reassess HEP; STM to L scalenes/pecs? progress cervical and stretching ROM to tolerance             Patient will benefit from skilled therapeutic intervention in order to improve the following deficits and impairments:  Hypomobility, Decreased activity tolerance, Decreased strength, Increased fascial restricitons, Impaired UE functional use, Pain, Increased muscle spasms, Improper body mechanics, Decreased range of motion, Postural dysfunction, Impaired flexibility, Impaired sensation  Visit Diagnosis: Cervicalgia  Paresthesia of skin  Abnormal posture     Problem List Patient Active Problem List   Diagnosis Date Noted   History of injury of abdominal wall 07/08/2018   Dyshidrotic hand dermatitis     09/07/2018 12/25/2020, 4:40 PM  [facility] [address] [place], [place], [place] Phone: [phone]    Fax:  [phone]  Name: MARSHEL GOLUBSKI MRN: Vincent Bauer Date of Birth: 1995/05/18

## 2020-12-27 ENCOUNTER — Other Ambulatory Visit: Payer: Self-pay

## 2020-12-27 ENCOUNTER — Ambulatory Visit: Payer: BC Managed Care – PPO | Admitting: Physical Therapy

## 2020-12-27 ENCOUNTER — Encounter: Payer: Self-pay | Admitting: Physical Therapy

## 2020-12-27 DIAGNOSIS — R293 Abnormal posture: Secondary | ICD-10-CM

## 2020-12-27 DIAGNOSIS — R202 Paresthesia of skin: Secondary | ICD-10-CM

## 2020-12-27 DIAGNOSIS — M542 Cervicalgia: Secondary | ICD-10-CM | POA: Diagnosis not present

## 2020-12-27 DIAGNOSIS — M62838 Other muscle spasm: Secondary | ICD-10-CM

## 2020-12-27 NOTE — Therapy (Signed)
[facility] [address] [place], [place], [address] Phone: [phone]   Fax:  [phone]  Physical Therapy Treatment  Patient Details  Name: Vincent Bauer MRN: [id number] Date of Birth: 1995/06/29 Referring Provider (PT): Danella Maiers, MD   Encounter Date: 12/27/2020   PT End of Session - 12/27/20 1554     Visit Number 3    Date for PT Re-Evaluation 01/31/21    Authorization Type An[employer]    PT Start Time 1512    PT Stop Time 1600    PT Time Calculation (min) 48 min    Activity Tolerance Patient tolerated treatment well    Behavior During Therapy [facility] for tasks assessed/performed;Flat affect             Past Medical History:  Diagnosis Date   Depression    Dyshidrotic hand dermatitis     Past Surgical History:  Procedure Laterality Date   TYMPANOTOMY      There were no vitals filed for this visit.   Subjective Assessment - 12/27/20 1514     Subjective Same pan from temple to shoulder, some tightness from last session in the trap    Currently in Pain? Yes    Pain Score 6     Pain Location --   L temple to shoulder                              [facility] Adult PT Treatment/Exercise - 12/27/20 0001       Shoulder Exercises: Seated   Other Seated Exercises OHP 8lb 2x10      Shoulder Exercises: Standing   Horizontal ABduction Strengthening;Both;20 reps;Theraband    Theraband Level (Shoulder Horizontal ABduction) Level 3 (Green)    ABduction Strengthening;Both;20 reps;Weights    Shoulder ABduction Weight (lbs) 4    Extension Strengthening;Both;20 reps;Weights    Extension Weight (lbs) 10    Other Standing Exercises LUE ER green abd to 90  2x10    Other Standing Exercises LUE D2 flex green 2x10      Shoulder Exercises: ROM/Strengthening   UBE (Upper Arm Bike) L2 x 2 min each    Nustep L5 x 6 min    Other ROM/Strengthening Exercises Rows & Lats 25lb 2x12      Modalities    Modalities Moist Heat      Moist Heat Therapy   Number Minutes Moist Heat 10 Minutes    Moist Heat Location Shoulder                      PT Short Term Goals - 12/20/20 1746       PT SHORT TERM GOAL #1   Title Patient to be independent with initial HEP.    Time 3    Period Weeks    Status New    Target Date 01/10/21               PT Long Term Goals - 12/20/20 1746       PT LONG TERM GOAL #1   Title Patient to be independent with advanced HEP.    Time 6    Period Weeks    Status New    Target Date 01/31/21      PT LONG TERM GOAL #2   Title Patient to demonstrate cervical AROM WFL and without pain limiting.    Time 6    Period  Weeks    Status New    Target Date 01/31/21      PT LONG TERM GOAL #3   Title Patient to demonstrate proper postural correction at rest and with activity.    Time 6    Period Weeks    Status New    Target Date 01/31/21      PT LONG TERM GOAL #4   Title Patient to return to outdoor activities without pain/paresthesias limiting.    Time 6    Period Weeks    Status New    Target Date 01/31/21      PT LONG TERM GOAL #5   Title Patient to report 85% improvement in neck/arm pain.    Time 6    Period Weeks    Status New    Target Date 01/31/21                   Plan - 12/27/20 1555     Clinical Impression Statement L shoulder tightness and soreness reported after last session that has eased up since. Some postural compensation noted today on UBE and with seated rows. Shoulder weakens present with standing abduction and with seated OHP.  Cues or sequencing needed with D2 flexion. Positive response to MHP    Personal Factors and Comorbidities Age;Comorbidity 1;Fitness;Past/Current Experience;Profession;Time since onset of injury/illness/exacerbation    Comorbidities depression    Examination-Activity Limitations Sleep;Bed Mobility;Sit;Bend;Carry;Dressing;Transfers;Hygiene/Grooming;Lift;Reach Overhead;Self  Feeding    Examination-Participation Restrictions Church    Stability/Clinical Decision Making Evolving/Moderate complexity    Rehab Potential Good    PT Frequency 2x / week    PT Duration 6 weeks    PT Treatment/Interventions ADLs/Self Care Home Management;Cryotherapy;Electrical Stimulation;Iontophoresis 4mg /ml Dexamethasone;Moist Heat;Traction;Therapeutic exercise;Therapeutic activities;Functional mobility training;Ultrasound;Neuromuscular re-education;Patient/family education;Manual techniques;Vasopneumatic Device;Taping;Energy conservation;Dry needling;Passive range of motion    PT Next Visit Plan STM to L scalenes/pecs? progress cervical and stretching ROM to tolerance             Patient will benefit from skilled therapeutic intervention in order to improve the following deficits and impairments:  Hypomobility, Decreased activity tolerance, Decreased strength, Increased fascial restricitons, Impaired UE functional use, Pain, Increased muscle spasms, Improper body mechanics, Decreased range of motion, Postural dysfunction, Impaired flexibility, Impaired sensation  Visit Diagnosis: Abnormal posture  Paresthesia of skin  Other muscle spasm  Cervicalgia     Problem List Patient Active Problem List   Diagnosis Date Noted   History of injury of abdominal wall 07/08/2018   Dyshidrotic hand dermatitis     09/07/2018 12/27/2020, 4:00 PM  [facility] [address] [place], [place], [place] Phone: [phone]   Fax:  [phone]  Name: Vincent Bauer MRN: Vincent Bauer Date of Birth: 06/21/1995

## 2021-01-02 ENCOUNTER — Ambulatory Visit
Payer: BC Managed Care – PPO | Attending: Thoracic Surgery (Cardiothoracic Vascular Surgery) | Admitting: Physical Therapy

## 2021-01-02 ENCOUNTER — Other Ambulatory Visit: Payer: Self-pay

## 2021-01-02 ENCOUNTER — Encounter: Payer: Self-pay | Admitting: Physical Therapy

## 2021-01-02 DIAGNOSIS — R202 Paresthesia of skin: Secondary | ICD-10-CM | POA: Diagnosis present

## 2021-01-02 DIAGNOSIS — R293 Abnormal posture: Secondary | ICD-10-CM | POA: Diagnosis not present

## 2021-01-02 DIAGNOSIS — M62838 Other muscle spasm: Secondary | ICD-10-CM | POA: Insufficient documentation

## 2021-01-02 DIAGNOSIS — M542 Cervicalgia: Secondary | ICD-10-CM | POA: Insufficient documentation

## 2021-01-02 NOTE — Therapy (Signed)
[place]. [place], [place], [address] Phone: [phone]   Fax:  [phone]  Physical Therapy Treatment  Patient Details  Name: Vincent Bauer MRN: [id number] Date of Birth: Feb 11, 1996 Referring Provider (PT): Ferdinand Cava, MD   Encounter Date: 01/02/2021   PT End of Session - 01/02/21 1420     Visit Number 4    Number of Visits 13    Date for PT Re-Evaluation 01/31/21    Authorization Type An[employer]    PT Start Time 1345    PT Stop Time 1425    PT Time Calculation (min) 40 min    Activity Tolerance Patient tolerated treatment well    Behavior During Therapy [facility] for tasks assessed/performed;Flat affect             Past Medical History:  Diagnosis Date   Depression    Dyshidrotic hand dermatitis     Past Surgical History:  Procedure Laterality Date   TYMPANOTOMY      There were no vitals filed for this visit.   Subjective Assessment - 01/02/21 1346     Subjective "Pinching really bad" Still hurts in the same places    Currently in Pain? Yes    Pain Score 7     Pain Location Shoulder    Pain Orientation Left                [facility] PT Assessment - 01/02/21 0001       AROM   Cervical Flexion 35   little pain   Cervical Extension 70    Cervical - Right Side Bend 46   pain   Cervical - Left Side Bend 40    Cervical - Right Rotation 52    Cervical - Left Rotation 56   pain                          [facility] Adult PT Treatment/Exercise - 01/02/21 0001       Shoulder Exercises: Standing   External Rotation Strengthening;Left;15 reps;Theraband   x2   Theraband Level (Shoulder External Rotation) Level 3 (Green)    Internal Rotation Strengthening;Left;15 reps;Theraband   x2   Theraband Level (Shoulder Internal Rotation) Level 3 (Green)    Flexion Strengthening;Both;Weights;20 reps    Shoulder Flexion Weight (lbs) 5    ABduction Strengthening;Both;20  reps;Weights    Shoulder ABduction Weight (lbs) 4    Other Standing Exercises AAROM 3lb WaTE bar flex, ext, IR up back x 10 each, Tricep Ext 25lb 2x10    Other Standing Exercises LUE D2 flex green 2x10      Shoulder Exercises: ROM/Strengthening   UBE (Upper Arm Bike) L2 x 3 min each    Other ROM/Strengthening Exercises Rows & Lats 25lb 2x12                      PT Short Term Goals - 01/02/21 1421       PT SHORT TERM GOAL #1   Title Patient to be independent with initial HEP.    Status Achieved               PT Long Term Goals - 01/02/21 1421       PT LONG TERM GOAL #2   Title Patient to demonstrate cervical AROM WFL and without pain limiting.    Status Partially Met  Plan - 01/02/21 1422     Clinical Impression Statement Pt has progressed increasing his cervical AROM in all directions. He did well with all exercise interventions. Full AAROM noted with WaTE bar. Pt did report some UE burning with internal and external rotation. Increase resistance tolerated with seated rows and lats. Pt reports that he will be swimming after today's session.    Personal Factors and Comorbidities Age;Comorbidity 1;Fitness;Past/Current Experience;Profession;Time since onset of injury/illness/exacerbation    Examination-Activity Limitations Sleep;Bed Mobility;Sit;Bend;Carry;Dressing;Transfers;Hygiene/Grooming;Lift;Reach Overhead;Self Feeding    Stability/Clinical Decision Making Evolving/Moderate complexity    Rehab Potential Good    PT Frequency 2x / week    PT Duration 6 weeks    PT Treatment/Interventions ADLs/Self Care Home Management;Cryotherapy;Electrical Stimulation;Iontophoresis 31m/ml Dexamethasone;Moist Heat;Traction;Therapeutic exercise;Therapeutic activities;Functional mobility training;Ultrasound;Neuromuscular re-education;Patient/family education;Manual techniques;Vasopneumatic Device;Taping;Energy conservation;Dry needling;Passive range of  motion    PT Next Visit Plan STM to L scalenes/pecs? progress cervical and stretching ROM to tolerance             Patient will benefit from skilled therapeutic intervention in order to improve the following deficits and impairments:  Hypomobility, Decreased activity tolerance, Decreased strength, Increased fascial restricitons, Impaired UE functional use, Pain, Increased muscle spasms, Improper body mechanics, Decreased range of motion, Postural dysfunction, Impaired flexibility, Impaired sensation  Visit Diagnosis: Abnormal posture  Paresthesia of skin  Cervicalgia  Other muscle spasm     Problem List Patient Active Problem List   Diagnosis Date Noted   History of injury of abdominal wall 07/08/2018   Dyshidrotic hand dermatitis     RScot Jun9/09/2020, 2:25 PM  [place] [address]Phone: [phone]  Fax:  [phone] Name: Vincent HARNERMRN: [id number] of Birth: 708-24-1997

## 2021-01-04 ENCOUNTER — Encounter: Payer: Self-pay | Admitting: Physical Therapy

## 2021-01-04 ENCOUNTER — Ambulatory Visit: Payer: BC Managed Care – PPO | Admitting: Physical Therapy

## 2021-01-04 ENCOUNTER — Other Ambulatory Visit: Payer: Self-pay

## 2021-01-04 DIAGNOSIS — M62838 Other muscle spasm: Secondary | ICD-10-CM

## 2021-01-04 DIAGNOSIS — R293 Abnormal posture: Secondary | ICD-10-CM | POA: Diagnosis not present

## 2021-01-04 DIAGNOSIS — M542 Cervicalgia: Secondary | ICD-10-CM

## 2021-01-04 NOTE — Therapy (Signed)
[facility] [address]. [place], [place], [address] Phone: [phone]   Fax:  [phone]  Physical Therapy Treatment  Patient Details  Name: Vincent Bauer MRN: [id number] Date of Birth: 1996-04-24 Referring Provider (PT): Danella Maiers, MD   Encounter Date: 01/04/2021   PT End of Session - 01/04/21 1423     Visit Number 5    Date for PT Re-Evaluation 01/31/21    Authorization Type [employer]    PT Start Time 1345    PT Stop Time 1425    PT Time Calculation (min) 40 min    Activity Tolerance Patient tolerated treatment well    Behavior During Therapy [facility] for tasks assessed/performed;Flat affect             Past Medical History:  Diagnosis Date   Depression    Dyshidrotic hand dermatitis     Past Surgical History:  Procedure Laterality Date   TYMPANOTOMY      There were no vitals filed for this visit.   Subjective Assessment - 01/04/21 1348     Subjective "more of the same" MD appoint next week    Currently in Pain? Yes    Pain Score 7     Pain Location Shoulder    Pain Orientation Left                               [facility] Adult PT Treatment/Exercise - 01/04/21 0001       Shoulder Exercises: Seated   Other Seated Exercises Bent over rows 5lb, Ext 5lb, Rev flys 5lb 2x10      Shoulder Exercises: Standing   Horizontal ABduction Strengthening;Both;20 reps;Theraband    Theraband Level (Shoulder Horizontal ABduction) Level 4 (Blue)    External Rotation Strengthening;Left;15 reps;Theraband   x2   Theraband Level (Shoulder External Rotation) Level 4 (Blue)    Internal Rotation Strengthening;Both;Theraband;15 reps   x2   Theraband Level (Shoulder Internal Rotation) Level 4 (Blue)    Other Standing Exercises Diag scapular Retraction 4lb 2x10      Shoulder Exercises: ROM/Strengthening   UBE (Upper Arm Bike) L2 x 3 min each    Other ROM/Strengthening Exercises Rows & Lats 35lb  2x10    Other ROM/Strengthening Exercises chest press 15lb 2x10      Shoulder Exercises: Stretch   Corner Stretch 10 seconds;5 reps                       PT Short Term Goals - 01/02/21 1421       PT SHORT TERM GOAL #1   Title Patient to be independent with initial HEP.    Status Achieved               PT Long Term Goals - 01/04/21 1349       PT LONG TERM GOAL #1   Title Patient to be independent with advanced HEP.    Status Achieved                   Plan - 01/04/21 1423     Clinical Impression Statement Pt continues to report L shoulder and neck pain. Posterior L shoulder pinching reported with bent over reverse fly's. He reports no functional Isostation but did have severe pian. He stated that he can tell a imitations difference between his UE. L shoulder fatigue noted with the increase reps in  external rotation. Pt reports that he return to MD on the 12 th.    Personal Factors and Comorbidities Age;Comorbidity 1;Fitness;Past/Current Experience;Profession;Time since onset of injury/illness/exacerbation    Examination-Activity Limitations Sleep;Bed Mobility;Sit;Bend;Carry;Dressing;Transfers;Hygiene/Grooming;Lift;Reach Overhead;Self Feeding    Stability/Clinical Decision Making Evolving/Moderate complexity    Rehab Potential Good    PT Frequency 2x / week    PT Duration 6 weeks    PT Treatment/Interventions ADLs/Self Care Home Management;Cryotherapy;Electrical Stimulation;Iontophoresis 4mg /ml Dexamethasone;Moist Heat;Traction;Therapeutic exercise;Therapeutic activities;Functional mobility training;Ultrasound;Neuromuscular re-education;Patient/family education;Manual techniques;Vasopneumatic Device;Taping;Energy conservation;Dry needling;Passive range of motion    PT Next Visit Plan STM to L scalenes/pecs? progress cervical and stretching ROM to tolerance, Get report from MD             Patient will benefit from skilled therapeutic intervention in  order to improve the following deficits and impairments:  Hypomobility, Decreased activity tolerance, Decreased strength, Increased fascial restricitons, Impaired UE functional use, Pain, Increased muscle spasms, Improper body mechanics, Decreased range of motion, Postural dysfunction, Impaired flexibility, Impaired sensation  Visit Diagnosis: Abnormal posture  Cervicalgia  Other muscle spasm     Problem List Patient Active Problem List   Diagnosis Date Noted   History of injury of abdominal wall 07/08/2018   Dyshidrotic hand dermatitis     09/07/2018, PTA 01/04/2021, 2:26 PM  [facility] [place] [facility] [address] [place], [place], [place] Phone: [phone]   Fax:  [phone]  Name: Vincent Bauer MRN: [name] Date of Birth: December 02, 1995

## 2021-01-08 ENCOUNTER — Ambulatory Visit: Payer: BC Managed Care – PPO | Admitting: Physical Therapy

## 2021-01-10 ENCOUNTER — Encounter: Payer: Self-pay | Admitting: Physical Therapy

## 2021-01-10 ENCOUNTER — Ambulatory Visit: Payer: BC Managed Care – PPO | Admitting: Physical Therapy

## 2021-01-10 ENCOUNTER — Other Ambulatory Visit: Payer: Self-pay

## 2021-01-10 DIAGNOSIS — M62838 Other muscle spasm: Secondary | ICD-10-CM

## 2021-01-10 DIAGNOSIS — R202 Paresthesia of skin: Secondary | ICD-10-CM

## 2021-01-10 DIAGNOSIS — R293 Abnormal posture: Secondary | ICD-10-CM

## 2021-01-10 DIAGNOSIS — M542 Cervicalgia: Secondary | ICD-10-CM

## 2021-01-10 NOTE — Therapy (Signed)
[facility] [place] [facility] [address] [place], [place], [address] Phone: [phone]   Fax:  [phone]  Physical Therapy Treatment  Patient Details  Name: Vincent Bauer MRN: [id number] Date of Birth: 05-02-1995 Referring Provider (PT): Danella Maiers, MD   Encounter Date: 01/10/2021   PT End of Session - 01/10/21 1434     Visit Number 6    Number of Visits 13    Date for PT Re-Evaluation 01/31/21    Authorization Type [employer]    PT Start Time 1354    PT Stop Time 1447    PT Time Calculation (min) 53 min    Activity Tolerance Patient tolerated treatment well    Behavior During Therapy [facility] for tasks assessed/performed;Flat affect             Past Medical History:  Diagnosis Date   Depression    Dyshidrotic hand dermatitis     Past Surgical History:  Procedure Laterality Date   TYMPANOTOMY      There were no vitals filed for this visit.   Subjective Assessment - 01/10/21 1431     Subjective Saw MD will have an injection in the future to see if surgery would help, still hurting, still daily HA    Currently in Pain? Yes    Pain Score 7     Pain Location Shoulder   HA   Pain Orientation Left    Aggravating Factors  lying on the left side, lifting    Pain Relieving Factors nothing really as helped                               [facility] Adult PT Treatment/Exercise - 01/10/21 0001       Modalities   Modalities Cryotherapy;Electrical Stimulation      Moist Heat Therapy   Number Minutes Moist Heat 10 Minutes    Moist Heat Location Shoulder      Cryotherapy   Number Minutes Cryotherapy 10 Minutes    Cryotherapy Location Other (comment)   head   Type of Cryotherapy Ice pack      Electrical Stimulation   Electrical Stimulation Location left uppper trap neck area    Electrical Stimulation Action IFC    Electrical Stimulation Parameters supine    Electrical Stimulation Goals Pain       Manual Therapy   Manual Therapy Passive ROM;Soft tissue mobilization    Soft tissue mobilization to the left upper trap, scalenes, cervical area    Passive ROM PROM of the left shoulder, arm, neural tension and nec              Trigger Point Dry Needling - 01/10/21 0001     Consent Given? Yes    Education Handout Provided Yes    Muscles Treated Head and Neck Upper trapezius;Suboccipitals    Upper Trapezius Response Twitch reponse elicited;Palpable increased muscle length    Suboccipitals Response Twitch response elicited;Palpable increased muscle length                     PT Short Term Goals - 01/02/21 1421       PT SHORT TERM GOAL #1   Title Patient to be independent with initial HEP.    Status Achieved               PT Long Term Goals - 01/04/21 1349  PT LONG TERM GOAL #1   Title Patient to be independent with advanced HEP.    Status Achieved                   Plan - 01/10/21 1435     Clinical Impression Statement Still with the pain without any relief, with daily HA, educated and gave handout about TpDN, I tried this in the left upper trap and the cervical area,  I also added neural tension stretches and some PROM of the neck and the shoulder, added estim to help with soreness    PT Next Visit Plan see how he responds    Consulted and Agree with Plan of Care Patient             Patient will benefit from skilled therapeutic intervention in order to improve the following deficits and impairments:  Hypomobility, Decreased activity tolerance, Decreased strength, Increased fascial restricitons, Impaired UE functional use, Pain, Increased muscle spasms, Improper body mechanics, Decreased range of motion, Postural dysfunction, Impaired flexibility, Impaired sensation  Visit Diagnosis: Abnormal posture  Cervicalgia  Other muscle spasm  Paresthesia of skin     Problem List Patient Active Problem List   Diagnosis Date Noted    History of injury of abdominal wall 07/08/2018   Dyshidrotic hand dermatitis     Jearld Lesch, PT 01/10/2021, 2:37 PM  [facility] [place] [facility] [address] [place], [place], [address] Phone: [phone]   Fax:  [phone]  Name: Vincent Bauer MRN: [id number] Date of Birth: November 17, 1995

## 2021-01-10 NOTE — Patient Instructions (Signed)

## 2021-01-15 ENCOUNTER — Encounter: Payer: Self-pay | Admitting: Physical Therapy

## 2021-01-15 ENCOUNTER — Ambulatory Visit: Payer: BC Managed Care – PPO | Admitting: Physical Therapy

## 2021-01-15 ENCOUNTER — Other Ambulatory Visit: Payer: Self-pay

## 2021-01-15 DIAGNOSIS — R293 Abnormal posture: Secondary | ICD-10-CM

## 2021-01-15 DIAGNOSIS — M542 Cervicalgia: Secondary | ICD-10-CM

## 2021-01-15 DIAGNOSIS — M62838 Other muscle spasm: Secondary | ICD-10-CM

## 2021-01-15 DIAGNOSIS — R202 Paresthesia of skin: Secondary | ICD-10-CM

## 2021-01-15 NOTE — Therapy (Signed)
[facility] [place] [facility] [address] [place], [place], [address] Phone: [phone]   Fax:  [phone]  Physical Therapy Treatment  Patient Details  Name: Vincent Bauer MRN: [id number] Date of Birth: 1996-03-20 Referring Provider (PT): Danella Maiers, MD   Encounter Date: 01/15/2021   PT End of Session - 01/15/21 1359     Visit Number 7    Number of Visits 13    Date for PT Re-Evaluation 01/31/21    Authorization Type Anthem [employer]    PT Start Time 1320    PT Stop Time 1359    PT Time Calculation (min) 39 min    Activity Tolerance Patient tolerated treatment well;Patient limited by pain    Behavior During Therapy [facility] for tasks assessed/performed;Flat affect             Past Medical History:  Diagnosis Date   Depression    Dyshidrotic hand dermatitis     Past Surgical History:  Procedure Laterality Date   TYMPANOTOMY      There were no vitals filed for this visit.   Subjective Assessment - 01/15/21 1319     [facility] Got good relief from DN for a couple days. May be getting a scalene block in early Oct.    Pertinent History depression    Diagnostic tests 12/05/20 MRI/MRA of UE: No discernible abnormality to suggest thoracic outlet syndrome. Narrowing of the costoclavicular space with arms up resulting in trace  impress on the undersurface of the subclavian artery without significant  stenosis. Narrowing of the subclavian vein at the level of the  costoclavicular space with arms up is likely physiologic. No thrombosis or  neovascular collaterals to suggest chronic stenosis.    Patient Stated Goals improve chances of recovery from surgery    Currently in Pain? Yes    Pain Score 5     Pain Location Neck    Pain Orientation Left    Pain Descriptors / Indicators Sharp    Pain Type Chronic pain    Pain Radiating Towards head                                [facility] Adult PT Treatment/Exercise - 01/15/21 0001       Exercises   Exercises Neck      Neck Exercises: Seated   Cervical Rotation Right;Left;10 reps    Cervical Rotation Limitations rotation SNAG   cues to avoid pushing inot pain   Other Seated Exercise cervical extension SNAG 10x to tolerance      Neck Exercises: Stretches   Other Neck Stretches L suboccipital stretch with self OP 2x30"   cues to avoid pushing into pain     Shoulder Exercises: ROM/Strengthening   UBE (Upper Arm Bike) L2 x 3 min each      Manual Therapy   Manual Therapy Scapular mobilization;Myofascial release;Manual Traction    Soft tissue mobilization STM to L SCM, scalenes, UT, cervical paraspinals, and suboccipitals    Myofascial Release manual TPR to L SCM, scalenes, UT, cervical paraspinals, and suboccipitals    Passive ROM L suboccipital stretch to tolerance 30"    Manual Traction cervical traction with towel assist   c/o pain in base of skull                    PT Education - 01/15/21  1359     Education Details update to HEP    Person(s) Educated Patient    Methods Explanation;Demonstration;Tactile cues;Verbal cues;Handout    Comprehension Verbalized understanding;Returned demonstration              PT Short Term Goals - 01/02/21 1421       PT SHORT TERM GOAL #1   Title Patient to be independent with initial HEP.    Status Achieved               PT Long Term Goals - 01/04/21 1349       PT LONG TERM GOAL #1   Title Patient to be independent with advanced HEP.    Status Achieved                   Plan - 01/15/21 1359     Clinical Impression Statement Patient arrived to session with report of relief for a couple days after DN. Notes that he is in the process of getting scheduled for a scalene block. Patient tolerated STM, TPR, manual cervical traction, and stretching to improve pain and soft tissue restriction. Patient  very tight and TTP in L UT, scalenes, UT, SCM, and suboccipitals. Poor tolerance for traction and stretching d/t pain, but fairly good tolerance to massage. Followed with self-cervical stretching and mobility work with consistent cueing to move within a tolerable range. Updated HEP with mobility exercises that were well-tolerated today. Patient reported understanding and without complaints at end of session.    Comorbidities depression    PT Treatment/Interventions ADLs/Self Care Home Management;Cryotherapy;Electrical Stimulation;Iontophoresis 4mg /ml Dexamethasone;Moist Heat;Traction;Therapeutic exercise;Therapeutic activities;Functional mobility training;Ultrasound;Neuromuscular re-education;Patient/family education;Manual techniques;Vasopneumatic Device;Taping;Energy conservation;Dry needling;Passive range of motion    PT Next Visit Plan possible DN, continue stretching and strengthening    Consulted and Agree with Plan of Care Patient             Patient will benefit from skilled therapeutic intervention in order to improve the following deficits and impairments:  Hypomobility, Decreased activity tolerance, Decreased strength, Increased fascial restricitons, Impaired UE functional use, Pain, Increased muscle spasms, Improper body mechanics, Decreased range of motion, Postural dysfunction, Impaired flexibility, Impaired sensation  Visit Diagnosis: Abnormal posture  Cervicalgia  Other muscle spasm  Paresthesia of skin     Problem List Patient Active Problem List   Diagnosis Date Noted   History of injury of abdominal wall 07/08/2018   Dyshidrotic hand dermatitis      09/07/2018, PT, DPT 01/15/21 2:01 PM    [facility] [place] [facility] [address] [place], [place], [place] Phone: [phone]   Fax:  [phone]  Name: Vincent Bauer MRN: [name] Date of Birth: Jun 13, 1995

## 2021-01-17 ENCOUNTER — Other Ambulatory Visit: Payer: Self-pay

## 2021-01-17 ENCOUNTER — Encounter: Payer: Self-pay | Admitting: Physical Therapy

## 2021-01-17 ENCOUNTER — Ambulatory Visit: Payer: BC Managed Care – PPO | Admitting: Physical Therapy

## 2021-01-17 DIAGNOSIS — M62838 Other muscle spasm: Secondary | ICD-10-CM

## 2021-01-17 DIAGNOSIS — R202 Paresthesia of skin: Secondary | ICD-10-CM

## 2021-01-17 DIAGNOSIS — R293 Abnormal posture: Secondary | ICD-10-CM | POA: Diagnosis not present

## 2021-01-17 DIAGNOSIS — M542 Cervicalgia: Secondary | ICD-10-CM
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
## 2021-01-17 NOTE — Patient Instructions (Signed)

## 2021-01-22 ENCOUNTER — Encounter: Payer: Self-pay | Admitting: Physical Therapy

## 2021-01-22 ENCOUNTER — Ambulatory Visit: Payer: BC Managed Care – PPO | Admitting: Physical Therapy

## 2021-01-22 ENCOUNTER — Other Ambulatory Visit: Payer: Self-pay

## 2021-01-22 DIAGNOSIS — R293 Abnormal posture: Secondary | ICD-10-CM | POA: Diagnosis not present

## 2021-01-22 DIAGNOSIS — M542 Cervicalgia: Secondary | ICD-10-CM

## 2021-01-22 DIAGNOSIS — M62838 Other muscle spasm: Secondary | ICD-10-CM
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
## 2021-01-24 ENCOUNTER — Other Ambulatory Visit: Payer: Self-pay

## 2021-01-24 ENCOUNTER — Encounter: Payer: Self-pay | Admitting: Physical Therapy

## 2021-01-24 ENCOUNTER — Ambulatory Visit: Payer: BC Managed Care – PPO | Admitting: Physical Therapy

## 2021-01-24 DIAGNOSIS — R293 Abnormal posture: Secondary | ICD-10-CM | POA: Diagnosis not present

## 2021-01-24 DIAGNOSIS — M542 Cervicalgia: Secondary | ICD-10-CM

## 2021-01-24 DIAGNOSIS — M62838 Other muscle spasm: Secondary | ICD-10-CM
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
## 2021-01-29 ENCOUNTER — Ambulatory Visit: Payer: BC Managed Care – PPO | Admitting: Physical Therapy

## 2021-02-22 ENCOUNTER — Other Ambulatory Visit: Payer: Self-pay

## 2021-02-22 ENCOUNTER — Ambulatory Visit (INDEPENDENT_AMBULATORY_CARE_PROVIDER_SITE_OTHER): Payer: BC Managed Care – PPO | Admitting: Sports Medicine

## 2021-02-22 ENCOUNTER — Ambulatory Visit
Admission: RE | Admit: 2021-02-22 | Discharge: 2021-02-22 | Disposition: A | Discharge status: Home / Self Care | Payer: BC Managed Care – PPO | Source: Ambulatory Visit | Attending: Sports Medicine | Admitting: Sports Medicine

## 2021-02-22 VITALS — BP 118/74 | Ht 69.0 in | Wt 166.0 lb

## 2021-02-22 DIAGNOSIS — G8929 Other chronic pain: Secondary | ICD-10-CM

## 2021-02-22 DIAGNOSIS — M25512 Pain in left shoulder: Secondary | ICD-10-CM

## 2021-02-22 NOTE — Progress Notes (Signed)
   Subjective:    Patient ID: Vincent Bauer, male    DOB: [birth year], 25 y.o.   MRN: [id number]  HPI  M[place] returns to [place] at the request of his physicians at [facility].  He has been thoroughly evaluated for thoracic outlet syndrome of the left upper extremity.  Evaluation did not show that he has thoracic outlet syndrome.  It was felt that a lot of his symptoms may be originating from the shoulder.  He localizes a lot of his pain to the periscapular area but also endorses popping and tightness diffusely throughout the shoulder with activity.  An MRI without contrast earlier this year showed some tendinopathy of the infraspinatus but was otherwise unremarkable.  This was not an MRI arthrogram.  An MRI of his cervical spine done in January of this year showed some minimal annular disc bulging at C4-C5 through C6-C7 but no significant foraminal or spinal stenosis.  Selby has had exhaustive physical therapy for his neck and shoulder pain without improvement.  He was placed on high doses of gabapentin which also were not helpful.  In fact, he is in the process of weaning off that medication currently.    Review of Systems As above    Objective:   Physical Exam  Developed, well nourished.  No acute distress  Left shoulder: Full range of motion but he does have pain with motion in each plane.  Some slight tenderness to palpation over the lateral shoulder as well as in the parascapular area.  No scapular winging.  Rotator cuff strength is 5/5.  Negative O'Brien's.  Negative clunk.  Neurovascularly intact distally.      Assessment & Plan:   Chronic persistent left shoulder pain-rule out labral tear  Patient has had exhaustive physical therapy and conservative treatment for this chronic shoulder pain.  Although an MRI done earlier this year showed only some infraspinatus tendinopathy, this was done without contrast.  Therefore, we need to order an MRI arthrogram specifically to rule out a  labral tear as a source of his chronic shoulder pain.  Phone follow-up with those results when available.  We will delineate further treatment based on those findings.  If no labral tear is identified, Easton may benefit from 2-3 visits with a physical therapist focusing on internal impingement of his rotator cuff given the pathology seen in the infraspinatus tendon and not the supraspinatus.  This note was dictated using Dragon naturally speaking software and may contain errors in syntax, spelling, or content which have not been identified prior to signing this note.

## 2021-03-06 ENCOUNTER — Ambulatory Visit
Admission: RE | Admit: 2021-03-06 | Discharge: 2021-03-06 | Disposition: A | Discharge status: Home / Self Care | Payer: BC Managed Care – PPO | Source: Ambulatory Visit | Attending: Sports Medicine | Admitting: Sports Medicine

## 2021-03-06 ENCOUNTER — Other Ambulatory Visit: Payer: Self-pay

## 2021-03-06 DIAGNOSIS — G8929 Other chronic pain: Secondary | ICD-10-CM

## 2021-03-06 DIAGNOSIS — M25512 Pain in left shoulder: Secondary | ICD-10-CM

## 2021-03-06 MED ORDER — IOPAMIDOL (ISOVUE-M 200) INJECTION 41%
15.0000 mL | Freq: Once | INTRAMUSCULAR | Status: AC
  Administered 2021-03-06: 15 mL via INTRA_ARTICULAR

## 2021-03-14 ENCOUNTER — Other Ambulatory Visit: Payer: Self-pay

## 2021-03-14 DIAGNOSIS — M25512 Pain in left shoulder: Secondary | ICD-10-CM

## 2021-03-14 DIAGNOSIS — G8929 Other chronic pain: Secondary | ICD-10-CM

## 2021-03-15 ENCOUNTER — Telehealth: Payer: Self-pay | Admitting: Sports Medicine

## 2021-03-15 NOTE — Telephone Encounter (Signed)
  Patient was notified via telephone yesterday of MRI arthrogram results of his left shoulder.  Arthrogram does show a possible small nondisplaced inferior labral tear.  I recommended consultation with Dr. August Saucer to discuss treatment options.  Follow-up with me as needed.

## 2021-03-28 ENCOUNTER — Ambulatory Visit: Payer: BC Managed Care – PPO | Admitting: Orthopedic Surgery

## 2021-05-14 ENCOUNTER — Ambulatory Visit: Payer: BC Managed Care – PPO | Admitting: Orthopedic Surgery

## 2021-06-09 ENCOUNTER — Other Ambulatory Visit: Payer: Self-pay

## 2021-06-09 ENCOUNTER — Emergency Department (HOSPITAL_COMMUNITY)
Admission: EM | Admit: 2021-06-09 | Discharge: 2021-06-09 | Disposition: A | Discharge status: Home / Self Care | Payer: Worker's Compensation | Attending: Emergency Medicine | Admitting: Emergency Medicine

## 2021-06-09 ENCOUNTER — Encounter (HOSPITAL_COMMUNITY): Payer: Self-pay | Admitting: *Deleted

## 2021-06-09 ENCOUNTER — Emergency Department (HOSPITAL_COMMUNITY): Payer: Worker's Compensation

## 2021-06-09 DIAGNOSIS — Y990 Civilian activity done for income or pay: Secondary | ICD-10-CM | POA: Diagnosis not present

## 2021-06-09 DIAGNOSIS — T148XXA Other injury of unspecified body region, initial encounter: Secondary | ICD-10-CM | POA: Diagnosis not present

## 2021-06-09 DIAGNOSIS — M79651 Pain in right thigh: Secondary | ICD-10-CM | POA: Diagnosis not present

## 2021-06-09 DIAGNOSIS — Z87891 Personal history of nicotine dependence: Secondary | ICD-10-CM | POA: Diagnosis not present

## 2021-06-09 DIAGNOSIS — M25561 Pain in right knee: Secondary | ICD-10-CM | POA: Insufficient documentation

## 2021-06-09 DIAGNOSIS — M79604 Pain in right leg: Secondary | ICD-10-CM | POA: Diagnosis not present

## 2021-06-09 DIAGNOSIS — M25521 Pain in right elbow: Secondary | ICD-10-CM | POA: Insufficient documentation

## 2021-06-09 DIAGNOSIS — S0993XA Unspecified injury of face, initial encounter: Secondary | ICD-10-CM | POA: Diagnosis present

## 2021-06-09 DIAGNOSIS — T07XXXA Unspecified multiple injuries, initial encounter: Secondary | ICD-10-CM

## 2021-06-09 DIAGNOSIS — R200 Anesthesia of skin: Secondary | ICD-10-CM | POA: Diagnosis not present

## 2021-06-09 DIAGNOSIS — T1490XA Injury, unspecified, initial encounter: Secondary | ICD-10-CM

## 2021-06-09 MED ORDER — ACETAMINOPHEN ER 650 MG PO TBCR: 650.0000 mg | EXTENDED_RELEASE_TABLET | Freq: Three times a day (TID) | ORAL | 0 refills | Status: DC | PRN

## 2021-06-09 MED ORDER — METHOCARBAMOL 500 MG PO TABS: 500.0000 mg | ORAL_TABLET | Freq: Two times a day (BID) | ORAL | 0 refills | Status: DC

## 2021-06-09 MED ORDER — ACETAMINOPHEN ER 650 MG PO TBCR: 650.0000 mg | EXTENDED_RELEASE_TABLET | Freq: Three times a day (TID) | ORAL | 0 refills | Status: AC | PRN

## 2021-06-09 MED ORDER — NAPROXEN 500 MG PO TABS: 500.0000 mg | ORAL_TABLET | Freq: Two times a day (BID) | ORAL | 0 refills | Status: DC

## 2021-06-09 MED ORDER — KETOROLAC TROMETHAMINE 60 MG/2ML IM SOLN
60.0000 mg | Freq: Once | INTRAMUSCULAR | Status: AC
  Administered 2021-06-09: 60 mg via INTRAMUSCULAR
  Filled 2021-06-09: qty 2
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
## 2021-06-09 NOTE — ED Triage Notes (Signed)
The pt reports that his rt hip is his main concerN????
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
## 2021-06-14 ENCOUNTER — Ambulatory Visit: Payer: Self-pay

## 2021-06-14 ENCOUNTER — Ambulatory Visit (INDEPENDENT_AMBULATORY_CARE_PROVIDER_SITE_OTHER): Payer: BC Managed Care – PPO | Admitting: Sports Medicine

## 2021-06-14 VITALS — BP 121/69 | Ht 69.0 in | Wt 164.0 lb

## 2021-06-14 DIAGNOSIS — M25521 Pain in right elbow: Secondary | ICD-10-CM | POA: Diagnosis not present

## 2021-06-14 DIAGNOSIS — S5001XA Contusion of right elbow, initial encounter: Secondary | ICD-10-CM | POA: Diagnosis not present

## 2021-06-14 DIAGNOSIS — S8001XA Contusion of right knee, initial encounter: Secondary | ICD-10-CM | POA: Diagnosis not present

## 2021-06-14 DIAGNOSIS — S8011XA Contusion of right lower leg, initial encounter: Secondary | ICD-10-CM | POA: Diagnosis not present

## 2021-06-14 MED ORDER — MELOXICAM 15 MG PO TABS: ORAL_TABLET | ORAL | 0 refills | Status: AC

## 2021-06-14 MED ORDER — METHOCARBAMOL 500 MG PO TABS: 500.0000 mg | ORAL_TABLET | Freq: Two times a day (BID) | ORAL | 0 refills | Status: AC
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
## 2021-06-14 NOTE — Progress Notes (Signed)
Subjective       Objective             Assessment/Plan

## 2021-06-28 ENCOUNTER — Other Ambulatory Visit: Payer: Self-pay

## 2021-06-28 ENCOUNTER — Ambulatory Visit (INDEPENDENT_AMBULATORY_CARE_PROVIDER_SITE_OTHER): Payer: BC Managed Care – PPO | Admitting: Orthopedic Surgery

## 2021-06-28 DIAGNOSIS — M25512 Pain in left shoulder: Secondary | ICD-10-CM | POA: Diagnosis not present

## 2021-06-28 DIAGNOSIS — G8929 Other chronic pain: Secondary | ICD-10-CM | POA: Diagnosis not present

## 2021-06-29 ENCOUNTER — Encounter: Payer: Self-pay | Admitting: Orthopedic Surgery

## 2021-06-29 NOTE — Progress Notes (Signed)
? ?Office Visit Note ?  ?Patient: Vincent Bauer           ?Date of Birth: 1995-07-28           ?MRN: [id number] ?Visit Date: 06/28/2021 ?Requested by: Vincent Cork, DO ?[address] ?[place],  [place] [address] ?PCP: Vincent Bowens, PA-C ? ?Subjective: ?Chief Complaint  ?Patient presents with  ? Right Shoulder - Pain  ? ? ?HPI: The area is a 26 year old patient with left shoulder pain.  He was doing push-ups 921 and he felt a pop with subsequent pain.  He is left-hand dominant.  The pain wakes him from sleep at night.  Takes muscle relaxers with some relief.  Reports a lot of tightness in his scapular and neck strap muscle region.  He denies any decreased range of motion but states he has more pain than weakness.  No prior shoulder surgery.  Notably he was evaluated for thoracic outlet syndrome at [facility] and that work-up was negative.  Has headaches as well.  Has had physical therapy which has not given him too much relief.  Has pain in the anterior superior and posterior aspect of the shoulder which radiates up to the head as well.  Has had only several episodes of numbness and tingling in the arm.  He did have a C-spine MRI in 2022 which did not show any neural compression.  He has seen a neurologist and also tried gabapentin which has not helped. ? ?Overall the patient states "this has healed a little bit since the injury".  He did do some renovations around his house and his whole arm became numb down to the elbow but not below the elbow.  He works as a Financial risk analyst.  MRI scan is reviewed and it shows a very small anterior inferior labral tear with no bony Bankart or Hill-Sachs lesion.  Rotator cuff appears intact.  No superior labral tear.  AC joint fairly unremarkable. ?             ?ROS: All systems reviewed are negative as they relate to the chief complaint within the history of present illness.  Patient denies  fevers or chills. ? ? ?Assessment & Plan: ?Visit Diagnoses:  ?1. Chronic  left shoulder pain   ? ? ?Plan: Impression is left shoulder arm and periscapular pain with no definite history of instability.  He has a lot of accessory symptoms that I would not necessarily expect if this were only coming from what appears to be on MRI scan a fairly diminutive labral cleft.  Nonetheless that is the only objective finding of structural pathology in the shoulder that we have.  This does not look like radiculopathy.  We talked a lot about options moving forward.  In general the decision point today is whether or not surgical intervention gives Vincent Bauer a reasonable chance for symptom improvement.  Based on his description of symptoms and the extensive work-up he has had up to this point with failure of conservative measures I would give labral repair a 50 or maybe 60% chance of improving his shoulder pain but at the cost of some very mild degree of loss of motion.  I did tell Vincent Bauer that I am not bad enthusiastic about recommending that to him but at the same time it is a little unclear exactly the source of the muscle tightness is and the sporadic pain that he is having all around the shoulder.  For that reason I think it would  be best to see how symptoms evolve this year.  We both agree that surgical intervention is not really a predictable pain reliever for the patient at this time.  Structurally however the neck and the shoulder otherwise look intact.  He will follow-up as needed. ? ?Follow-Up Instructions: Return if symptoms worsen or fail to improve.  ? ?Orders:  ?No orders of the defined types were placed in this encounter. ? ?No orders of the defined types were placed in this encounter. ? ? ? ? Procedures: ?No procedures performed ? ? ?Clinical Data: ?No additional findings. ? ?Objective: ?Vital Signs: There were no vitals taken for this visit. ? ?Physical Exam:  ? ?Constitutional: Patient appears well-developed ?HEENT:  ?Head: Normocephalic ?Eyes:EOM are normal ?Neck: Normal range of  motion ?Cardiovascular: Normal rate ?Pulmonary/chest: Effort normal ?Neurologic: Patient is alert ?Skin: Skin is warm ?Psychiatric: Patient has normal mood and affect ? ? ?Ortho Exam: Ortho exam demonstrates good cervical spine range of motion with flexion chin to chest extension 40 degrees rotation about 70 degrees bilaterally.  No scapular dyskinesia with forward flexion.  He does have very mild voluntary scapulothoracic crepitus on the left compared to the right.  No discrete tenderness around the medial border of the scapula.  He has negative apprehension relocation testing.  Less than a centimeter sulcus sign bilaterally.  Shoulder stability is 1+ anterior and posterior on the left.  Negative O'Brien's testing.  No discrete asymmetric AC joint tenderness.  Shoulder range of motion on the right is 50/100/180.  Shoulder range of motion on the left is 50/110/175.  I do not feel any coarseness popping or mechanical grinding with labral load testing on the left. ? ?Specialty Comments:  ?No specialty comments available. ? ?Imaging: ?No results found. ? ? ?PMFS History: ?Patient Active Problem List  ? Diagnosis Date Noted  ? History of injury of abdominal wall 07/08/2018  ? Dyshidrotic hand dermatitis   ? ?Past Medical History:  ?Diagnosis Date  ? Depression   ? Dyshidrotic hand dermatitis   ?  ?Family History  ?Problem Relation Age of Onset  ? Cervical cancer Maternal Grandmother   ? Prostate cancer Maternal Grandfather   ?  ?Past Surgical History:  ?Procedure Laterality Date  ? TYMPANOTOMY    ? ?Social History  ? ?Occupational History  ? Not on file  ?Tobacco Use  ? Smoking status: Former  ?  Types: E-cigarettes  ? Smokeless tobacco: Never  ?Vaping Use  ? Vaping Use: Never used  ?Substance and Sexual Activity  ? Alcohol use: Yes  ?  Comment: 3-4 times a week  ? Drug use: No  ? Sexual activity: Not on file  ? ? ? ? ? ?

## 2021-07-12 ENCOUNTER — Ambulatory Visit (INDEPENDENT_AMBULATORY_CARE_PROVIDER_SITE_OTHER): Payer: Worker's Compensation | Admitting: Sports Medicine

## 2021-07-12 VITALS — BP 112/70 | Ht 69.0 in | Wt 165.0 lb

## 2021-07-12 DIAGNOSIS — S5001XD Contusion of right elbow, subsequent encounter: Secondary | ICD-10-CM

## 2021-07-12 DIAGNOSIS — M25551 Pain in right hip: Secondary | ICD-10-CM

## 2021-07-12 NOTE — Progress Notes (Signed)
? ?  Subjective:  ? ? Patient ID: Vincent Bauer, male    DOB: 28-Dec-1995, 26 y.o.   MRN: [id number] ? ?HPI ? ?Vincent Bauer presents today for a work-related injury.  Right elbow pain is improving.  He still has some discomfort with lifting heavy objects.  However, he continues to complain of diffuse right hip pain especially with ambulation.  Although it is improving, he endorses constant pain throughout the day.  Pain does occasionally radiate down the leg.  Previous x-rays of the right femur and right hip were unremarkable.  He has noticed some benefit with meloxicam and methocarbamol.  He has been out of work since his last office visit with me 4 weeks ago. ? ? ? ?Review of Systems ?As above ?   ?Objective:  ? Physical Exam ? ?Well-developed, well-nourished.  No acute distress ? ?Right elbow: Full range of motion.  No effusion.  There is a small palpable residual hematoma but it is improved from his last visit.  No joint instability.  Good strength.  Good pulses distally. ? ?Right hip: Smooth painless hip range of motion with a negative logroll but he does have diffuse tenderness to palpation around the anterior and lateral hip.  Tenderness to palpation around the posterior hip as well.  Neurovascular intact distally.  Ambulating with out a significant limp. ? ? ?   ?Assessment & Plan:  ? ?Improving right elbow pain secondary to hematoma/contusion ?Persistent right hip pain status post work-related injury ? ?Given his persistent pain in the right hip despite conservative treatment to date I would like to proceed with an MRI scan to evaluate further.  Phone follow-up with those results when available.  We will decide if any further treatment is necessary based on those findings.  In the meantime, [name] will remain out of work until April 17.  We will make further out of work decisions after I review his MRI of his hip.  He may continue to take meloxicam and methocarbamol as needed. ? ?This note was dictated using Dragon  naturally speaking software and may contain errors in syntax, spelling, or content which have not been identified prior to signing this note.  ?

## 2021-07-12 NOTE — Patient Instructions (Addendum)
Dr. Tonye Becket Rodulfo ?[facility] ?[address] ?[place], [place] [address] ?[phone] ? ?DR. JEREMY PHILLIPS ?[facility] ?[address] ?[place], [place] [address] ?([phone] ? ?

## 2021-07-21 ENCOUNTER — Other Ambulatory Visit: Payer: Medicaid Other

## 2021-07-28 ENCOUNTER — Ambulatory Visit
Admission: RE | Admit: 2021-07-28 | Discharge: 2021-07-28 | Disposition: A | Discharge status: Home / Self Care | Payer: Medicaid Other | Source: Ambulatory Visit | Attending: Sports Medicine | Admitting: Sports Medicine

## 2021-07-28 DIAGNOSIS — M25551 Pain in right hip: Secondary | ICD-10-CM

## 2021-07-31 ENCOUNTER — Encounter: Payer: Self-pay | Admitting: Sports Medicine

## 2021-08-06 ENCOUNTER — Ambulatory Visit (INDEPENDENT_AMBULATORY_CARE_PROVIDER_SITE_OTHER): Payer: Worker's Compensation | Admitting: Sports Medicine

## 2021-08-06 ENCOUNTER — Ambulatory Visit
Admission: RE | Admit: 2021-08-06 | Discharge: 2021-08-06 | Disposition: A | Discharge status: Home / Self Care | Payer: Worker's Compensation | Source: Ambulatory Visit | Attending: Sports Medicine | Admitting: Sports Medicine

## 2021-08-06 VITALS — BP 112/72 | Ht 69.0 in | Wt 165.0 lb

## 2021-08-06 DIAGNOSIS — G8929 Other chronic pain: Secondary | ICD-10-CM

## 2021-08-06 DIAGNOSIS — M25551 Pain in right hip: Secondary | ICD-10-CM

## 2021-08-06 DIAGNOSIS — M25521 Pain in right elbow: Secondary | ICD-10-CM

## 2021-08-06 DIAGNOSIS — M25512 Pain in left shoulder: Secondary | ICD-10-CM | POA: Diagnosis not present

## 2021-08-06 NOTE — Patient Instructions (Addendum)
We will refer you to Dr. Lucie Leather to discuss PRP of your shoulder and hip. ?His office will call you to schedule an appt. ?If you do not hear from them within a week, please let us know. ? ?[facility] ?[address], [place], [place] [address] ?Phone: [phone] ? ?Please get the x-ray of your elbow at your earliest convenience.  ?

## 2021-08-07 NOTE — Progress Notes (Signed)
? ?  Subjective:  ? ? Patient ID: Vincent Bauer, male    DOB: [birth year], 26 y.o.   MRN: [id number] ? ?HPI  ? ?Kemaurion presents today to discuss MRI findings of his right hip.  There is no evidence of hip fracture or dislocation or AVN.  He does have some findings consistent with possible femoral acetabular impingement.  He also continues to endorse left shoulder pain.  An MRI of his left shoulder previously showed a small labral tear.  A consultation with Dr. Marlou Sa was ordered and Dr. Marlou Sa recommended conservative treatment.  Aloys is not interested in cortisone injections but is interested in trying PRP.  He also continues to endorse some lateral right elbow pain.  Previous x-rays were unremarkable. ? ? ? ?Review of Systems ?As above ?   ?Objective:  ? Physical Exam ? ?Well-developed, well-nourished.  No acute distress ? ?Right elbow: Full range of motion.  No effusion.  No ecchymosis.  Good strength.  Good stability.  Neurovascularly intact distally. ? ? ?   ?Assessment & Plan:  ? ?Chronic left shoulder pain with MRI evidence of small labral tear ?Right hip pain status post work-related injury with MRI evidence of possible femoral acetabular impingement ?Right elbow contusion ? ?Reassurance regarding his right elbow pain and x-rays.  I think this will resolve over time.  Since Jerad is interested in discussing PRP, I have made a referral to Dr. Thedore Mins at [facility] to discuss this further.  Follow-up with me as needed. ? ?This note was dictated using Dragon naturally speaking software and may contain errors in syntax, spelling, or content which have not been identified prior to signing this note.  ?

## 2021-08-08 ENCOUNTER — Encounter: Payer: Self-pay | Admitting: Sports Medicine

## 2021-08-09 ENCOUNTER — Encounter: Payer: Self-pay | Admitting: Sports Medicine

## 2021-08-09 ENCOUNTER — Other Ambulatory Visit: Payer: Self-pay

## 2021-08-09 DIAGNOSIS — Z713 Dietary counseling and surveillance: Secondary | ICD-10-CM

## 2021-08-13 ENCOUNTER — Encounter: Payer: Self-pay | Admitting: Sports Medicine

## 2021-08-14 ENCOUNTER — Other Ambulatory Visit: Payer: Self-pay

## 2021-08-14 DIAGNOSIS — M25551 Pain in right hip: Secondary | ICD-10-CM

## 2021-08-21 ENCOUNTER — Encounter: Payer: Self-pay | Admitting: Sports Medicine

## 2021-08-22 ENCOUNTER — Ambulatory Visit: Payer: Medicaid Other | Admitting: Orthopaedic Surgery

## 2021-08-22 ENCOUNTER — Ambulatory Visit (HOSPITAL_BASED_OUTPATIENT_CLINIC_OR_DEPARTMENT_OTHER)
Admission: RE | Admit: 2021-08-22 | Discharge: 2021-08-22 | Disposition: A | Discharge status: Home / Self Care | Payer: Worker's Compensation | Source: Ambulatory Visit | Attending: Orthopaedic Surgery | Admitting: Orthopaedic Surgery

## 2021-08-22 ENCOUNTER — Other Ambulatory Visit (HOSPITAL_BASED_OUTPATIENT_CLINIC_OR_DEPARTMENT_OTHER): Payer: Self-pay | Admitting: Orthopaedic Surgery

## 2021-08-22 ENCOUNTER — Ambulatory Visit (INDEPENDENT_AMBULATORY_CARE_PROVIDER_SITE_OTHER): Payer: Worker's Compensation | Admitting: Orthopaedic Surgery

## 2021-08-22 DIAGNOSIS — M67959 Unspecified disorder of synovium and tendon, unspecified thigh: Secondary | ICD-10-CM

## 2021-08-22 DIAGNOSIS — M25551 Pain in right hip: Secondary | ICD-10-CM | POA: Diagnosis present

## 2021-08-22 MED ORDER — LIDOCAINE HCL 1 % IJ SOLN
4.0000 mL | INTRAMUSCULAR | Status: AC | PRN
  Administered 2021-08-22: 4 mL

## 2021-08-22 MED ORDER — TRIAMCINOLONE ACETONIDE 40 MG/ML IJ SUSP
80.0000 mg | INTRAMUSCULAR | Status: AC | PRN
  Administered 2021-08-22: 80 mg via INTRA_ARTICULAR
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
## 2021-08-23 ENCOUNTER — Encounter (HOSPITAL_BASED_OUTPATIENT_CLINIC_OR_DEPARTMENT_OTHER): Payer: Self-pay | Admitting: Orthopaedic Surgery

## 2021-08-27 ENCOUNTER — Encounter: Payer: Self-pay | Admitting: Sports Medicine

## 2021-08-30 ENCOUNTER — Ambulatory Visit (INDEPENDENT_AMBULATORY_CARE_PROVIDER_SITE_OTHER): Payer: BC Managed Care – PPO | Admitting: Orthopaedic Surgery

## 2021-08-30 DIAGNOSIS — M25512 Pain in left shoulder: Secondary | ICD-10-CM

## 2021-08-30 DIAGNOSIS — G8929 Other chronic pain: Secondary | ICD-10-CM | POA: Diagnosis not present
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
## 2021-09-12 ENCOUNTER — Ambulatory Visit (HOSPITAL_BASED_OUTPATIENT_CLINIC_OR_DEPARTMENT_OTHER): Payer: Worker's Compensation | Attending: Orthopaedic Surgery | Admitting: Physical Therapy

## 2021-09-12 ENCOUNTER — Encounter (HOSPITAL_BASED_OUTPATIENT_CLINIC_OR_DEPARTMENT_OTHER): Payer: Self-pay | Admitting: Physical Therapy

## 2021-09-12 DIAGNOSIS — M6281 Muscle weakness (generalized): Secondary | ICD-10-CM | POA: Diagnosis not present

## 2021-09-12 DIAGNOSIS — M542 Cervicalgia: Secondary | ICD-10-CM | POA: Insufficient documentation

## 2021-09-12 DIAGNOSIS — M5459 Other low back pain: Secondary | ICD-10-CM | POA: Diagnosis not present

## 2021-09-12 DIAGNOSIS — G8929 Other chronic pain: Secondary | ICD-10-CM | POA: Diagnosis not present

## 2021-09-12 DIAGNOSIS — M67959 Unspecified disorder of synovium and tendon, unspecified thigh: Secondary | ICD-10-CM | POA: Insufficient documentation

## 2021-09-12 DIAGNOSIS — M25512 Pain in left shoulder: Secondary | ICD-10-CM | POA: Insufficient documentation

## 2021-09-12 DIAGNOSIS — M25551 Pain in right hip: Secondary | ICD-10-CM | POA: Diagnosis not present
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
## 2021-09-19 ENCOUNTER — Ambulatory Visit (INDEPENDENT_AMBULATORY_CARE_PROVIDER_SITE_OTHER): Payer: Worker's Compensation | Admitting: Orthopaedic Surgery

## 2021-09-19 DIAGNOSIS — M67959 Unspecified disorder of synovium and tendon, unspecified thigh: Secondary | ICD-10-CM | POA: Diagnosis not present
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
## 2021-09-21 ENCOUNTER — Encounter (HOSPITAL_BASED_OUTPATIENT_CLINIC_OR_DEPARTMENT_OTHER): Payer: Self-pay | Admitting: Physical Therapy

## 2021-09-21 ENCOUNTER — Ambulatory Visit (HOSPITAL_BASED_OUTPATIENT_CLINIC_OR_DEPARTMENT_OTHER): Payer: Worker's Compensation | Admitting: Physical Therapy

## 2021-09-21 DIAGNOSIS — G8929 Other chronic pain: Secondary | ICD-10-CM

## 2021-09-21 DIAGNOSIS — M5459 Other low back pain: Secondary | ICD-10-CM

## 2021-09-21 DIAGNOSIS — M542 Cervicalgia: Secondary | ICD-10-CM

## 2021-09-21 DIAGNOSIS — M6281 Muscle weakness (generalized): Secondary | ICD-10-CM

## 2021-09-21 DIAGNOSIS — M67959 Unspecified disorder of synovium and tendon, unspecified thigh: Secondary | ICD-10-CM | POA: Diagnosis not present

## 2021-09-21 DIAGNOSIS — M25551 Pain in right hip: Secondary | ICD-10-CM
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
## 2021-09-26 ENCOUNTER — Ambulatory Visit (HOSPITAL_BASED_OUTPATIENT_CLINIC_OR_DEPARTMENT_OTHER): Payer: Worker's Compensation | Admitting: Physical Therapy

## 2021-09-26 DIAGNOSIS — G8929 Other chronic pain: Secondary | ICD-10-CM

## 2021-09-26 DIAGNOSIS — M542 Cervicalgia: Secondary | ICD-10-CM

## 2021-09-26 DIAGNOSIS — M67959 Unspecified disorder of synovium and tendon, unspecified thigh: Secondary | ICD-10-CM | POA: Diagnosis not present

## 2021-09-26 DIAGNOSIS — M25551 Pain in right hip: Secondary | ICD-10-CM

## 2021-09-26 NOTE — Patient Instructions (Signed)
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
## 2021-10-02 ENCOUNTER — Ambulatory Visit (HOSPITAL_BASED_OUTPATIENT_CLINIC_OR_DEPARTMENT_OTHER): Payer: Worker's Compensation | Attending: Orthopaedic Surgery | Admitting: Physical Therapy

## 2021-10-02 ENCOUNTER — Encounter (HOSPITAL_BASED_OUTPATIENT_CLINIC_OR_DEPARTMENT_OTHER): Payer: Self-pay | Admitting: Physical Therapy

## 2021-10-02 DIAGNOSIS — M6281 Muscle weakness (generalized): Secondary | ICD-10-CM

## 2021-10-02 DIAGNOSIS — M25512 Pain in left shoulder: Secondary | ICD-10-CM | POA: Insufficient documentation

## 2021-10-02 DIAGNOSIS — M5459 Other low back pain: Secondary | ICD-10-CM | POA: Diagnosis present

## 2021-10-02 DIAGNOSIS — M542 Cervicalgia: Secondary | ICD-10-CM | POA: Diagnosis present

## 2021-10-02 DIAGNOSIS — M25551 Pain in right hip: Secondary | ICD-10-CM | POA: Diagnosis present

## 2021-10-02 DIAGNOSIS — G8929 Other chronic pain: Secondary | ICD-10-CM
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
## 2021-10-04 ENCOUNTER — Ambulatory Visit (INDEPENDENT_AMBULATORY_CARE_PROVIDER_SITE_OTHER): Payer: BC Managed Care – PPO | Admitting: Orthopaedic Surgery

## 2021-10-04 DIAGNOSIS — G540 Brachial plexus disorders: Secondary | ICD-10-CM

## 2021-10-04 DIAGNOSIS — M67959 Unspecified disorder of synovium and tendon, unspecified thigh: Secondary | ICD-10-CM
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
## 2021-10-05 ENCOUNTER — Ambulatory Visit (HOSPITAL_BASED_OUTPATIENT_CLINIC_OR_DEPARTMENT_OTHER): Payer: Worker's Compensation | Admitting: Physical Therapy

## 2021-10-05 DIAGNOSIS — G8929 Other chronic pain: Secondary | ICD-10-CM | POA: Diagnosis present

## 2021-10-05 DIAGNOSIS — M25512 Pain in left shoulder: Secondary | ICD-10-CM | POA: Diagnosis present

## 2021-10-05 DIAGNOSIS — M5459 Other low back pain: Secondary | ICD-10-CM | POA: Diagnosis present

## 2021-10-05 DIAGNOSIS — M542 Cervicalgia: Secondary | ICD-10-CM | POA: Diagnosis present

## 2021-10-05 DIAGNOSIS — M25551 Pain in right hip: Secondary | ICD-10-CM | POA: Diagnosis present

## 2021-10-05 DIAGNOSIS — M6281 Muscle weakness (generalized): Secondary | ICD-10-CM | POA: Diagnosis present

## 2021-10-08 ENCOUNTER — Encounter (HOSPITAL_BASED_OUTPATIENT_CLINIC_OR_DEPARTMENT_OTHER): Payer: Self-pay | Admitting: Physical Therapy

## 2021-10-08 ENCOUNTER — Ambulatory Visit (HOSPITAL_BASED_OUTPATIENT_CLINIC_OR_DEPARTMENT_OTHER): Payer: Worker's Compensation | Admitting: Physical Therapy

## 2021-10-08 DIAGNOSIS — M25512 Pain in left shoulder: Secondary | ICD-10-CM | POA: Diagnosis not present

## 2021-10-08 DIAGNOSIS — G8929 Other chronic pain: Secondary | ICD-10-CM

## 2021-10-08 DIAGNOSIS — M6281 Muscle weakness (generalized): Secondary | ICD-10-CM

## 2021-10-08 DIAGNOSIS — M5459 Other low back pain: Secondary | ICD-10-CM

## 2021-10-08 DIAGNOSIS — M25551 Pain in right hip: Secondary | ICD-10-CM

## 2021-10-08 DIAGNOSIS — M542 Cervicalgia: Secondary | ICD-10-CM
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
## 2021-10-10 ENCOUNTER — Encounter (HOSPITAL_BASED_OUTPATIENT_CLINIC_OR_DEPARTMENT_OTHER): Payer: Self-pay | Admitting: Physical Therapy

## 2021-10-10 ENCOUNTER — Ambulatory Visit (HOSPITAL_BASED_OUTPATIENT_CLINIC_OR_DEPARTMENT_OTHER): Payer: Worker's Compensation | Admitting: Physical Therapy

## 2021-10-10 DIAGNOSIS — M542 Cervicalgia: Secondary | ICD-10-CM

## 2021-10-10 DIAGNOSIS — M25512 Pain in left shoulder: Secondary | ICD-10-CM | POA: Diagnosis not present

## 2021-10-10 DIAGNOSIS — M25551 Pain in right hip: Secondary | ICD-10-CM

## 2021-10-10 DIAGNOSIS — M5459 Other low back pain: Secondary | ICD-10-CM

## 2021-10-10 DIAGNOSIS — G8929 Other chronic pain: Secondary | ICD-10-CM

## 2021-10-10 DIAGNOSIS — M6281 Muscle weakness (generalized): Secondary | ICD-10-CM
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
## 2021-10-16 ENCOUNTER — Ambulatory Visit (HOSPITAL_BASED_OUTPATIENT_CLINIC_OR_DEPARTMENT_OTHER): Payer: Worker's Compensation | Attending: Orthopaedic Surgery | Admitting: Physical Therapy

## 2021-10-16 ENCOUNTER — Encounter (HOSPITAL_BASED_OUTPATIENT_CLINIC_OR_DEPARTMENT_OTHER): Payer: Self-pay | Admitting: Physical Therapy

## 2021-10-16 DIAGNOSIS — M25551 Pain in right hip: Secondary | ICD-10-CM | POA: Diagnosis present

## 2021-10-16 DIAGNOSIS — M25512 Pain in left shoulder: Secondary | ICD-10-CM | POA: Insufficient documentation

## 2021-10-16 DIAGNOSIS — G8929 Other chronic pain: Secondary | ICD-10-CM | POA: Insufficient documentation

## 2021-10-16 DIAGNOSIS — M542 Cervicalgia: Secondary | ICD-10-CM | POA: Diagnosis present

## 2021-10-16 DIAGNOSIS — M5459 Other low back pain: Secondary | ICD-10-CM | POA: Insufficient documentation
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
## 2021-10-18 ENCOUNTER — Ambulatory Visit (HOSPITAL_BASED_OUTPATIENT_CLINIC_OR_DEPARTMENT_OTHER): Payer: Worker's Compensation | Admitting: Physical Therapy

## 2021-10-18 DIAGNOSIS — M25512 Pain in left shoulder: Secondary | ICD-10-CM | POA: Diagnosis not present

## 2021-10-18 DIAGNOSIS — M5459 Other low back pain: Secondary | ICD-10-CM

## 2021-10-18 DIAGNOSIS — M542 Cervicalgia: Secondary | ICD-10-CM

## 2021-10-18 DIAGNOSIS — M25551 Pain in right hip: Secondary | ICD-10-CM

## 2021-10-18 DIAGNOSIS — M6281 Muscle weakness (generalized): Secondary | ICD-10-CM

## 2021-10-18 DIAGNOSIS — G8929 Other chronic pain: Secondary | ICD-10-CM
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
## 2021-10-19 ENCOUNTER — Encounter (HOSPITAL_BASED_OUTPATIENT_CLINIC_OR_DEPARTMENT_OTHER): Payer: Self-pay | Admitting: Physical Therapy

## 2021-10-19 ENCOUNTER — Other Ambulatory Visit: Payer: Self-pay | Admitting: *Deleted

## 2021-10-19 DIAGNOSIS — M25519 Pain in unspecified shoulder: Secondary | ICD-10-CM

## 2021-10-23 ENCOUNTER — Ambulatory Visit (HOSPITAL_BASED_OUTPATIENT_CLINIC_OR_DEPARTMENT_OTHER): Payer: Worker's Compensation | Attending: Orthopaedic Surgery | Admitting: Physical Therapy

## 2021-10-23 ENCOUNTER — Encounter (HOSPITAL_BASED_OUTPATIENT_CLINIC_OR_DEPARTMENT_OTHER): Payer: Self-pay | Admitting: Physical Therapy

## 2021-10-23 DIAGNOSIS — M6281 Muscle weakness (generalized): Secondary | ICD-10-CM | POA: Diagnosis present

## 2021-10-23 DIAGNOSIS — M25551 Pain in right hip: Secondary | ICD-10-CM | POA: Diagnosis present

## 2021-10-23 DIAGNOSIS — M5459 Other low back pain: Secondary | ICD-10-CM | POA: Insufficient documentation

## 2021-10-25 ENCOUNTER — Ambulatory Visit (HOSPITAL_BASED_OUTPATIENT_CLINIC_OR_DEPARTMENT_OTHER): Payer: Worker's Compensation | Admitting: Physical Therapy

## 2021-10-29 ENCOUNTER — Encounter (HOSPITAL_BASED_OUTPATIENT_CLINIC_OR_DEPARTMENT_OTHER): Payer: Self-pay | Admitting: Physical Therapy

## 2021-10-29 ENCOUNTER — Ambulatory Visit (HOSPITAL_BASED_OUTPATIENT_CLINIC_OR_DEPARTMENT_OTHER): Payer: Worker's Compensation | Attending: Orthopaedic Surgery | Admitting: Physical Therapy

## 2021-10-29 DIAGNOSIS — M542 Cervicalgia: Secondary | ICD-10-CM | POA: Diagnosis present

## 2021-10-29 DIAGNOSIS — M5459 Other low back pain: Secondary | ICD-10-CM | POA: Insufficient documentation

## 2021-10-29 DIAGNOSIS — G8929 Other chronic pain: Secondary | ICD-10-CM | POA: Diagnosis present

## 2021-10-29 DIAGNOSIS — M25551 Pain in right hip: Secondary | ICD-10-CM | POA: Insufficient documentation

## 2021-10-29 DIAGNOSIS — M6281 Muscle weakness (generalized): Secondary | ICD-10-CM | POA: Insufficient documentation

## 2021-10-29 DIAGNOSIS — M25512 Pain in left shoulder: Secondary | ICD-10-CM | POA: Insufficient documentation
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
## 2021-11-01 ENCOUNTER — Ambulatory Visit (HOSPITAL_BASED_OUTPATIENT_CLINIC_OR_DEPARTMENT_OTHER): Payer: Worker's Compensation | Admitting: Physical Therapy

## 2021-11-06 ENCOUNTER — Ambulatory Visit (HOSPITAL_BASED_OUTPATIENT_CLINIC_OR_DEPARTMENT_OTHER): Payer: Worker's Compensation | Attending: Orthopaedic Surgery | Admitting: Physical Therapy

## 2021-11-06 DIAGNOSIS — M25512 Pain in left shoulder: Secondary | ICD-10-CM | POA: Insufficient documentation

## 2021-11-06 DIAGNOSIS — M6281 Muscle weakness (generalized): Secondary | ICD-10-CM | POA: Diagnosis present

## 2021-11-06 DIAGNOSIS — G8929 Other chronic pain: Secondary | ICD-10-CM | POA: Diagnosis present

## 2021-11-06 DIAGNOSIS — M5459 Other low back pain: Secondary | ICD-10-CM | POA: Diagnosis present

## 2021-11-06 DIAGNOSIS — M25551 Pain in right hip: Secondary | ICD-10-CM | POA: Insufficient documentation
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
## 2021-11-08 ENCOUNTER — Ambulatory Visit (HOSPITAL_BASED_OUTPATIENT_CLINIC_OR_DEPARTMENT_OTHER): Payer: Worker's Compensation | Admitting: Physical Therapy

## 2021-11-08 NOTE — Progress Notes (Signed)
Office Note     CC: Rule out arterial thoracic outlet syndrome Requesting Provider:  Isabella Bowens, PA*  HPI: Vincent Bauer is a 26 y.o. (1995-07-07) male presenting at the request of Dr. Maricela Bo to rule out arterial thoracic outlet syndrome.  Three years ago, Vincent Bauer was moving items when he felt a strain in his left shoulder.  This point tenderness worsened with push-ups several weeks later causing pain to radiate down his left arm.  This pain has been present since that time.  He initially went work-up at [facility], including neurogenic thoracic outlet.  EMG negative, MRI negative, scalene block ineffective.  MRI also demonstrated no significant arterial or venous compression, mild arterial compression at 180 degrees abduction.  There continues to have left arm symptoms.  These are present at rest and include point tenderness at the shoulder both anteriorly and along the infraspinatus.  He has numbness that radiates into the hand which is mostly in the fourth and fifth digits, but can affect other fingers as well.  He has noted more veins and some white splotches on the dorsal aspect of his hand.  The pain is omnipresent, but does not worsen with overhead activity.  When asked about other symptoms, he noted intermittent palpitations when laying on his left arm.   Past Medical History:  Diagnosis Date   Depression    Dyshidrotic hand dermatitis     Past Surgical History:  Procedure Laterality Date   TYMPANOTOMY      Social History   Socioeconomic History   Marital status: Single    Spouse name: Not on file   Number of children: Not on file   Years of education: Not on file   Highest education level: Not on file  Occupational History   Not on file  Tobacco Use   Smoking status: Former    Types: E-cigarettes   Smokeless tobacco: Never  Vaping Use   Vaping Use: Never used  Substance and Sexual Activity   Alcohol use: Yes    Comment: 3-4 times a week   Drug use:  No   Sexual activity: Not on file  Other Topics Concern   Not on file  Social History Narrative   Not on file   Social Determinants of Health   Financial Resource Strain: Not on file  Food Insecurity: Not on file  Transportation Needs: Not on file  Physical Activity: Not on file  Stress: Not on file  Social Connections: Not on file  Intimate Partner Violence: Not on file   Family History  Problem Relation Age of Onset   Cervical cancer Maternal Grandmother    Prostate cancer Maternal Grandfather     Current Outpatient Medications  Medication Sig Dispense Refill   DULoxetine (CYMBALTA) 30 MG capsule Take 30 mg by mouth daily.     gabapentin (NEURONTIN) 400 MG capsule Take 400 mg by mouth 2 (two) times daily. (Patient not taking: Reported on 09/12/2021)     LORazepam (ATIVAN) 2 MG tablet Take 2 mg by mouth daily. (Patient not taking: Reported on 09/12/2021)     meloxicam (MOBIC) 15 MG tablet Take 1 tablet daily with food for 14 days. Then take as needed. (Patient not taking: Reported on 09/12/2021) 40 tablet 0   methocarbamol (ROBAXIN) 500 MG tablet Take 1 tablet (500 mg total) by mouth 2 (two) times daily. (Patient not taking: Reported on 09/12/2021) 20 tablet 0   triamcinolone cream (KENALOG) 0.1 % Apply 1 application topically 2 (  two) times daily. (Patient not taking: Reported on 06/26/2020) 30 g 0   No current facility-administered medications for this visit.    No Known Allergies   REVIEW OF SYSTEMS:  [X]  denotes positive finding, [ ]  denotes negative finding Cardiac  Comments:  Chest pain or chest pressure:    Shortness of breath upon exertion:    Short of breath when lying flat:    Irregular heart rhythm:        Vascular    Pain in calf, thigh, or hip brought on by ambulation:    Pain in feet at night that wakes you up from your sleep:     Blood clot in your veins:    Leg swelling:         Pulmonary    Oxygen at home:    Productive cough:     Wheezing:          Neurologic    Sudden weakness in arms or legs:  X   Sudden numbness in arms or legs:  X   Sudden onset of difficulty speaking or slurred speech:    Temporary loss of vision in one eye:     Problems with dizziness:         Gastrointestinal    Blood in stool:     Vomited blood:         Genitourinary    Burning when urinating:     Blood in urine:        Psychiatric    Major depression:         Hematologic    Bleeding problems:    Problems with blood clotting too easily:        Skin    Rashes or ulcers:        Constitutional    Fever or chills:      PHYSICAL EXAMINATION:  There were no vitals filed for this visit.  General:  WDWN in NAD; vital signs documented above Gait: Not observed HENT: WNL, normocephalic Pulmonary: normal non-labored breathing , without wheezing Cardiac: regular HR Abdomen: soft, NT, no masses Skin: without rashes Vascular Exam/Pulses:  Right Left  Radial 2+ (normal) 2+ (normal)  Ulnar 2+ (normal) 1+ (weak)                   Extremities: without ischemic changes, without Gangrene , without cellulitis; without open wounds; no wounds on the fingers, pulses present with test, wright test,  Musculoskeletal: no muscle wasting or atrophy  Neurologic: A&O X 3;  No focal weakness or paresthesias are detected Psychiatric:  The pt has Normal affect.   Non-Invasive Vascular Imaging:   See chart    ASSESSMENT/PLAN: Vincent Bauer is a 26 y.o. male presenting with chronic left upper extremity pain.  His TOS work-up at [facility] was very thorough.  Imaging reviewed.  MRI negative for compression, EMG negative, scalene block did not alleviate symptoms, PT unsuccessful.  No asymmetric swelling, no history of DVT concerning for venous TOS.  While the arterial study today demonstrated some dampening in the 180 degree position, this is normal.  No increased muscle cramping with use.  Able to do overhead activities.  No embolic events to the fingertips.   No muscle entrapment involving the axillary artery. The white areas appreciated on the dorsal aspect of the hand appear to be dermal lesions. Waxing and waning superficial veins can be a product of overall fluid status.  I do not think Tracie has neurogenic,  arterial, or venous thoracic outlet syndrome. I do not think pectoralis minor syndrome is supported by negative EEG and does not address the posterior scapular pain appreciated. I defer further management to orthopedic surgery.    Victorino Sparrow, MD [facility] [phone]

## 2021-11-09 ENCOUNTER — Ambulatory Visit (INDEPENDENT_AMBULATORY_CARE_PROVIDER_SITE_OTHER): Payer: BC Managed Care – PPO | Admitting: Vascular Surgery

## 2021-11-09 ENCOUNTER — Encounter: Payer: Self-pay | Admitting: Vascular Surgery

## 2021-11-09 ENCOUNTER — Ambulatory Visit (HOSPITAL_COMMUNITY)
Admission: RE | Admit: 2021-11-09 | Discharge: 2021-11-09 | Disposition: A | Discharge status: Home / Self Care | Payer: BC Managed Care – PPO | Source: Ambulatory Visit | Attending: Vascular Surgery | Admitting: Vascular Surgery

## 2021-11-09 VITALS — BP 115/75 | HR 73 | Temp 98.6°F | Resp 20 | Ht 69.0 in | Wt 162.0 lb

## 2021-11-09 DIAGNOSIS — M25519 Pain in unspecified shoulder: Secondary | ICD-10-CM | POA: Diagnosis not present

## 2021-11-09 DIAGNOSIS — M25512 Pain in left shoulder: Secondary | ICD-10-CM | POA: Diagnosis not present

## 2021-11-21 ENCOUNTER — Ambulatory Visit (INDEPENDENT_AMBULATORY_CARE_PROVIDER_SITE_OTHER): Payer: BC Managed Care – PPO | Admitting: Orthopaedic Surgery

## 2021-11-21 DIAGNOSIS — M67959 Unspecified disorder of synovium and tendon, unspecified thigh: Secondary | ICD-10-CM | POA: Diagnosis not present
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
## 2022-06-26 ENCOUNTER — Ambulatory Visit (HOSPITAL_BASED_OUTPATIENT_CLINIC_OR_DEPARTMENT_OTHER): Payer: Medicaid Other | Admitting: Orthopaedic Surgery

## 2022-09-20 ENCOUNTER — Encounter (HOSPITAL_BASED_OUTPATIENT_CLINIC_OR_DEPARTMENT_OTHER): Payer: Self-pay | Admitting: Orthopaedic Surgery

## 2023-09-03 ENCOUNTER — Ambulatory Visit (HOSPITAL_BASED_OUTPATIENT_CLINIC_OR_DEPARTMENT_OTHER): Admitting: Orthopaedic Surgery

## 2023-09-03 ENCOUNTER — Ambulatory Visit (INDEPENDENT_AMBULATORY_CARE_PROVIDER_SITE_OTHER)

## 2023-09-03 DIAGNOSIS — M545 Low back pain, unspecified: Secondary | ICD-10-CM

## 2023-09-03 NOTE — Progress Notes (Signed)
 Chief Complaint: Right posterior oblique pain     History of Present Illness:   09/03/2023: Presents today for follow-up of his right posterior lower back pain.  He has begun doing exercises as part of his job which are requiring Burpee's and significant activities involving twisting of his core.  Since this time he has noticed pain about the right posterior back area which does radiate around the right oblique.  Denies any radiating pain down the right leg.    Surgical History:   None  PMH/PSH/Family History/Social History/Meds/Allergies:    Past Medical History:  Diagnosis Date   Depression    Dyshidrotic hand dermatitis    Past Surgical History:  Procedure Laterality Date   TYMPANOTOMY     Social History   Socioeconomic History   Marital status: Single    Spouse name: Not on file   Number of children: Not on file   Years of education: Not on file   Highest education level: Not on file  Occupational History   Not on file  Tobacco Use   Smoking status: Former    Types: E-cigarettes   Smokeless tobacco: Never  Vaping Use   Vaping status: Never Used  Substance and Sexual Activity   Alcohol use: Yes    Comment: 3-4 times a week   Drug use: No   Sexual activity: Not on file  Other Topics Concern   Not on file  Social History Narrative   Not on file   Social Drivers of Health   Financial Resource Strain: Not on file  Food Insecurity: No Food Insecurity (08/20/2023)   Received from [facility] ([facility]   Hunger Vital Sign    Worried About Running Out of Food in the Last Year: Never true    Ran Out of Food in the Last Year: Never true  Transportation Needs: No Transportation Needs (08/20/2023)   Received from [facility] ([facility])   PRAPARE - Administrator, Civil Service (Medical): No    Lack of Transportation (Non-Medical): No  Physical Activity: Not on file  Stress: Not on file  Social  Connections: Unknown (10/21/2022)   Received from [facility]   Social Network    Social Network: Not on file   Family History  Problem Relation Age of Onset   Cervical cancer Maternal Grandmother    Prostate cancer Maternal Grandfather    No Known Allergies Current Outpatient Medications  Medication Sig Dispense Refill   DULoxetine (CYMBALTA) 30 MG capsule Take 30 mg by mouth daily. (Patient not taking: Reported on 11/09/2021)     gabapentin (NEURONTIN) 400 MG capsule Take 400 mg by mouth 2 (two) times daily. (Patient not taking: Reported on 09/12/2021)     LORazepam  (ATIVAN ) 2 MG tablet Take 2 mg by mouth daily. (Patient not taking: Reported on 09/12/2021)     meloxicam  (MOBIC ) 15 MG tablet Take 1 tablet daily with food for 14 days. Then take as needed. (Patient not taking: Reported on 09/12/2021) 40 tablet 0   methocarbamol  (ROBAXIN ) 500 MG tablet Take 1 tablet (500 mg total) by mouth 2 (two) times daily. (Patient not taking: Reported on 09/12/2021) 20 tablet 0   triamcinolone  cream (KENALOG ) 0.1 % Apply 1 application topically 2 (two) times daily. (Patient not taking: Reported on 06/26/2020) 30  g 0   No current facility-administered medications for this visit.   DG Lumbar Spine Complete Result Date: 09/03/2023 CLINICAL DATA:  Back pain. EXAM: LUMBAR SPINE - COMPLETE 4+ VIEW COMPARISON:  July 24, 2012 FINDINGS: There is no evidence of lumbar spine fracture. Alignment is normal. Intervertebral disc spaces are maintained. IMPRESSION: Negative. Electronically Signed   By: Anna Barnes M.D.   On: 09/03/2023 15:39    Review of Systems:   A ROS was performed including pertinent positives and negatives as documented in the HPI.  Physical Exam :   Constitutional: NAD and appears stated age Neurological: Alert and oriented Psych: Appropriate affect and cooperative There were no vitals taken for this visit.   Comprehensive Musculoskeletal Exam:     Tenderness about the right posterior  oblique which radiates around the side.  This is worse with twisting.  Negative straight leg raise    Imaging:    I personally reviewed and interpreted the radiographs.   Assessment:   28 y.o. male presents with right oblique strain in the setting of new exercises with his work.  This time I would like him to work with physical therapy to work on the right oblique for dry needling.  I will plan to see him back in 4 weeks for reassessment Plan :    -Return to clinic 4 weeks for reassessment  I personally saw and evaluated the patient, and participated in the management and treatment plan.  Wilhelmenia Harada, MD Attending Physician, Orthopedic Surgery  This document was dictated using Dragon voice recognition software. A reasonable attempt at proof reading has been made to minimize errors.

## 2023-10-01 ENCOUNTER — Ambulatory Visit (HOSPITAL_BASED_OUTPATIENT_CLINIC_OR_DEPARTMENT_OTHER): Admitting: Orthopaedic Surgery

## 2023-10-05 NOTE — Therapy (Signed)
 OUTPATIENT PHYSICAL THERAPY THORACOLUMBAR EVALUATION   Patient Name: Vincent Bauer MRN: [id number] DOB:[birth year], 28 y.o., male Today's Date: 10/05/2023  END OF SESSION:   Past Medical History:  Diagnosis Date   Depression    Dyshidrotic hand dermatitis    Past Surgical History:  Procedure Laterality Date   TYMPANOTOMY     Patient Active Problem List   Diagnosis Date Noted   History of injury of abdominal wall 07/08/2018   Dyshidrotic hand dermatitis     PCP: ***  REFERRING PROVIDER: Wilhelmenia Harada, MD   REFERRING DIAG: M54.50 (ICD-10-CM) - Acute right-sided low back pain without sciatica   Rationale for Evaluation and Treatment: Rehabilitation  THERAPY DIAG:  No diagnosis found.  ONSET DATE: Mid-April  SUBJECTIVE:                                                                                                                                                                                           SUBJECTIVE STATEMENT: Pt has been training in [place]  and has to perform a physical fitness class for work.  He had to perform a lot repetitions of the exercises including thrusters and Burpees.  Pt began having pain R quadrant pain including abdominal, lumbar, and side.  Pt also had R sided hip pain.  Initially had some pain in his R LE though not now.  Pt states he was sent home because he could not perform the exercises due to pain.  He plans to return to the training and has to be able to complete the exercise class.  Pt states he is feeling much better now.    MD note indicated posterior lumbar back program.  with right oblique strain in the setting of new exercises with his work. This time I would like him to work with physical therapy to work on the right oblique for dry needling.   Pt reports he has been working out and mostly performing upper/lower body resistance training and bike riding.  Pt states he has pain with kettlebell exercises and has not been performing  those exercises.  Pt can have some pain with leg press.  He has been going to the [facility] and performing exercises at home.  Pt states he has pain with prolonged activity.  Pt has pain with yard work and lifting heavy objects.     Pt received PT for R hip and L shoulder pain in 2023.  Pt states he fully recovered from those issues.   PERTINENT HISTORY:    PAIN:  NPRS:  2/10 current, 7/10 worst, 0/10 best Worst pain is with prolonged lifting from the floor  Location:  R sided abdominal, lumbar, lateral trunk.  R hip    PRECAUTIONS: {Therapy precautions:24002}  RED FLAGS: {PT Red Flags:29287}   WEIGHT BEARING RESTRICTIONS: No  FALLS:  Has patient fallen in last 6 months? No  LIVING ENVIRONMENT: Lives with: lives with their family Lives in: 1 story home with basement Stairs: basement stairs, steps to enter/exit home with rail   OCCUPATION: Pt is a Engineer, maintenance.   PLOF: Independent  PATIENT GOALS: ***  NEXT MD VISIT: ***  OBJECTIVE:  Note: Objective measures were completed at Evaluation unless otherwise noted.  DIAGNOSTIC FINDINGS:  Lumbar x rays: There is no evidence of lumbar spine fracture. Alignment is normal. Intervertebral disc spaces are maintained.   IMPRESSION: Negative.  PATIENT SURVEYS:  {rehab surveys:24030}  COGNITION: Overall cognitive status: Within functional limits for tasks assessed      MUSCLE LENGTH: Hamstrings: tightness bilat Thomas test: Right *** deg; Left *** deg  POSTURE: {posture:25561}  PALPATION: Pt has moderate soft tissue tightness in R sided lumbar paraspinals and tenderness in R lumbar flank.    LUMBAR ROM:   AROM eval  Flexion WFL  Extension WNL "little bit of pain"  Right lateral flexion WFL  Left lateral flexion WFL with little pain  Right rotation WFL with little pain  Left rotation WFL   (Blank rows = not tested)  LOWER EXTREMITY ROM:     {AROM/PROM:27142}  Right eval Left eval  Hip flexion    Hip  extension    Hip abduction    Hip adduction    Hip internal rotation    Hip external rotation    Knee flexion    Knee extension    Ankle dorsiflexion    Ankle plantarflexion    Ankle inversion    Ankle eversion     (Blank rows = not tested)  LOWER EXTREMITY MMT:    MMT Right eval Left eval  Hip flexion 5/5 5/5  Hip extension 5/5 5/5  Hip abduction 5/5 5/5  Hip adduction    Hip internal rotation    Hip external rotation 4+/5 5/5  Knee flexion 5/5 5/5  Knee extension 5/5 5/5  Ankle dorsiflexion    Ankle plantarflexion    Ankle inversion    Ankle eversion     (Blank rows = not tested)  LUMBAR SPECIAL TESTS:  SLR Test:  negative bilat  FUNCTIONAL TESTS:  {Functional tests:24029}  GAIT: Assistive device utilized: None Level of assistance: Complete Independence Comments: Pt has no pain with gait.  No limp.  He has limited reciprocal arm swing.  TREATMENT:                                                                                                                                  PATIENT EDUCATION:  Education details: *** Person educated: {Person educated:25204} Education method: {Education Method:25205} Education comprehension: {Education Comprehension:25206}  HOME EXERCISE PROGRAM: ***  ASSESSMENT:  CLINICAL IMPRESSION: Patient  is a *** y.o. *** who was seen today for physical therapy evaluation and treatment for ***.   OBJECTIVE IMPAIRMENTS: {opptimpairments:25111}.   ACTIVITY LIMITATIONS: {activitylimitations:27494}  PARTICIPATION LIMITATIONS: {participationrestrictions:25113}  PERSONAL FACTORS: {Personal factors:25162} are also affecting patient's functional outcome.   REHAB POTENTIAL: {rehabpotential:25112}  CLINICAL DECISION MAKING: {clinical decision making:25114}  EVALUATION COMPLEXITY: {Evaluation complexity:25115}   GOALS:   SHORT TERM GOALS: Target date:  10/27/2023   *** Baseline: Goal status: INITIAL  2.  *** Baseline:   Goal status: INITIAL  3.  *** Baseline:  Goal status: INITIAL  4.  *** Baseline:  Goal status: INITIAL  5.  *** Baseline:  Goal status: INITIAL  6.  *** Baseline:  Goal status: INITIAL  LONG TERM GOALS: Target date: 11/17/2023   *** Baseline:  Goal status: INITIAL  2.  *** Baseline:  Goal status: INITIAL  3.  *** Baseline:  Goal status: INITIAL  4.  *** Baseline:  Goal status: INITIAL  5.  *** Baseline:  Goal status: INITIAL  6.  *** Baseline:  Goal status: INITIAL  PLAN:  PT FREQUENCY: 1-2x/week  PT DURATION: 6 weeks  PLANNED INTERVENTIONS: {rehab planned interventions:25118::"97110-Therapeutic exercises","97530- Therapeutic [phone]- Neuromuscular re-education","97535- Self HQIO","96295- Manual therapy"}.  PLAN FOR NEXT SESSION: STM to lumbar paraspinals   Marnie Siren, PT 10/05/2023, 8:32 AM

## 2023-10-06 ENCOUNTER — Other Ambulatory Visit: Payer: Self-pay

## 2023-10-06 ENCOUNTER — Encounter (HOSPITAL_BASED_OUTPATIENT_CLINIC_OR_DEPARTMENT_OTHER): Payer: Self-pay | Admitting: Physical Therapy

## 2023-10-06 ENCOUNTER — Ambulatory Visit (HOSPITAL_BASED_OUTPATIENT_CLINIC_OR_DEPARTMENT_OTHER): Attending: Orthopaedic Surgery | Admitting: Physical Therapy

## 2023-10-06 DIAGNOSIS — M5459 Other low back pain: Secondary | ICD-10-CM | POA: Insufficient documentation

## 2023-10-06 DIAGNOSIS — M25551 Pain in right hip: Secondary | ICD-10-CM | POA: Insufficient documentation

## 2023-10-06 DIAGNOSIS — M545 Low back pain, unspecified: Secondary | ICD-10-CM | POA: Insufficient documentation

## 2023-10-23 ENCOUNTER — Encounter (HOSPITAL_BASED_OUTPATIENT_CLINIC_OR_DEPARTMENT_OTHER): Admitting: Physical Therapy

## 2023-10-27 ENCOUNTER — Encounter (HOSPITAL_BASED_OUTPATIENT_CLINIC_OR_DEPARTMENT_OTHER)

## 2023-11-05 ENCOUNTER — Encounter (HOSPITAL_BASED_OUTPATIENT_CLINIC_OR_DEPARTMENT_OTHER): Admitting: Physical Therapy

## 2023-11-05 ENCOUNTER — Ambulatory Visit (HOSPITAL_BASED_OUTPATIENT_CLINIC_OR_DEPARTMENT_OTHER): Admitting: Orthopaedic Surgery

## 2023-11-12 ENCOUNTER — Ambulatory Visit (HOSPITAL_BASED_OUTPATIENT_CLINIC_OR_DEPARTMENT_OTHER): Payer: Self-pay | Attending: Orthopaedic Surgery | Admitting: Physical Therapy

## 2023-11-19 ENCOUNTER — Encounter (HOSPITAL_BASED_OUTPATIENT_CLINIC_OR_DEPARTMENT_OTHER): Admitting: Physical Therapy

## 2023-11-26 ENCOUNTER — Encounter (HOSPITAL_BASED_OUTPATIENT_CLINIC_OR_DEPARTMENT_OTHER): Admitting: Physical Therapy

## 2024-03-01 ENCOUNTER — Encounter: Payer: Self-pay | Admitting: Radiology
